# Patient Record
Sex: Female | Born: 1990 | Hispanic: Yes | Marital: Single | Smoking: Never smoker
Health system: Southern US, Community
[De-identification: ages and names within clinical notes are randomized; demographics above are authoritative.]

## PROBLEM LIST (undated history)

## (undated) DIAGNOSIS — T7840XA Allergy, unspecified, initial encounter: Secondary | ICD-10-CM

## (undated) DIAGNOSIS — E079 Disorder of thyroid, unspecified: Secondary | ICD-10-CM

## (undated) DIAGNOSIS — F419 Anxiety disorder, unspecified: Secondary | ICD-10-CM

## (undated) DIAGNOSIS — M159 Polyosteoarthritis, unspecified: Secondary | ICD-10-CM

## (undated) HISTORY — DX: Polyosteoarthritis, unspecified: M15.9

## (undated) HISTORY — DX: Allergy, unspecified, initial encounter: T78.40XA

## (undated) HISTORY — DX: Anxiety disorder, unspecified: F41.9

---

## 2010-03-18 ENCOUNTER — Ambulatory Visit: Payer: Self-pay | Admitting: Family Medicine

## 2010-03-18 DIAGNOSIS — L738 Other specified follicular disorders: Secondary | ICD-10-CM

## 2010-03-18 DIAGNOSIS — B019 Varicella without complication: Secondary | ICD-10-CM | POA: Insufficient documentation

## 2010-09-14 NOTE — Assessment & Plan Note (Signed)
Summary: NOV varicella   Vital Signs:  Patient profile:   20 year old female Height:      67.25 inches Weight:      146 pounds BMI:     22.78 O2 Sat:      98 % on Room air Pulse rate:   76 / minute BP sitting:   105 / 66  (left arm) Cuff size:   large  Vitals Entered By: Payton Spark CMA (March 18, 2010 10:26 AM)  O2 Flow:  Room air CC: New to est. Saw bright blood after bowel movement x 2 weeks ago. No problems since. Also c/o rash on abd x 1+ week.   Primary Care Provider:  Seymour Bars DO  CC:  New to est. Saw bright blood after bowel movement x 2 weeks ago. No problems since. Also c/o rash on abd x 1+ week.Marland Kitchen  History of Present Illness: 20 yo WF presents for NOV.  She has been seen at [facility] as a child.  She is Previously healthy.  Her mom was clinically diagnosed with chickenpox earlier this week and she started to have a similar rash 5 days ago.  She has pruritis with new lesions each day.  All of them have scabbed over as of now.  Scattered on her abdomen, face, back with a chronic papular rash on her arms and legs.  She denies fevers, sore throat or malaise.  Had the varicella vaccine in childhood.    Current Medications (verified): 1)  None  Allergies (verified): No Known Drug Allergies  Past History:  Past Medical History: G0 ? 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 She lives in a dorm with 2 roomates. She is not a smoker. She does some exercise. No boyfriends.  Review of Systems       no fevers/sweats/weakness, unexplained wt loss/gain, no change in vision, no difficulty hearing, ringing in ears, no hay fever/allergies, no CP/discomfort, no palpitations, no breast lump/nipple discharge, no cough/wheeze, + blood in stool, no N/V/D, no nocturia, no leaking urine, no unusual vag bleeding, no vaginal/penile  discharge, no muscle/joint pain, + rash, no new/changing mole, + HA, no memory loss, + anxiety, no sleep problem, no depression, no unexplained lumps, no easy bruising/bleeding, no concern with sexual function   Physical Exam  General:  alert, well-developed, well-nourished, and well-hydrated.   Head:  normocephalic and atraumatic.   Eyes:  conjunctiva clear Nose:  no nasal discharge.   Mouth:  pharynx pink and moist.  no lesions of the oral mucosa Neck:  no masses.   Lungs:  Normal respiratory effort, chest expands symmetrically. Lungs are clear to auscultation, no crackles or wheezes. Heart:  Normal rate and regular rhythm. S1 and S2 normal without gallop, murmur, click, rub or other extra sounds. Skin:  3 abdominal lesions with a rose colored oval base and a scabbed over vesicle over the lop.  One on the glabella and one on the R cheek.  Nontender.  papular, hypertrophic rash over the upper arms and legs.   Cervical Nodes:  No lymphadenopathy noted Psych:  slightly anxious.     Impression & Recommendations:  Problem # 1:  CHICKENPOX (ICD-052.9) 5-6 days into mild varicella illness with contact exposure at home. Clinically, she is stable.  Advised leukwarm baths/ showers with topical Caladryl cream or benadryl for itching. She is  contagious until all lesions have scabbed over. Call if any complications like fever, HA, neck pain, etc.  Problem # 2:  FOLLICULITIS (ICD-704.8) Rash, chronic, over arms and legs appears to be folliculitis.   I gave her a h/o on this condition today. Consider use of Minocycline daily in low dose for treatment.  Patient Instructions: 1)  For Chicken Pox -- use lukewarm water in the bath or shower. 2)  Cover lesion with Caladryl cream. 3)  You are contagious until all lesions have crusted over. 4)  Take a Multivitamin Daily. 5)  Add Vitamin C during cold and flu season. 6)  For itchy throat and runny nose, add Claritin once daily. 7)  Wash your hands  before eating and avoid touching your face.  8)  Read thru treatment options for folliculitis and call me if you want to start antbiotic treatment for this.

## 2010-12-20 ENCOUNTER — Encounter: Payer: Self-pay | Admitting: Family Medicine

## 2010-12-20 ENCOUNTER — Ambulatory Visit (INDEPENDENT_AMBULATORY_CARE_PROVIDER_SITE_OTHER): Payer: BC Managed Care – PPO | Admitting: Family Medicine

## 2010-12-20 VITALS — BP 87/56 | HR 84 | Ht 67.75 in | Wt 147.0 lb

## 2010-12-20 DIAGNOSIS — Z0000 Encounter for general adult medical examination without abnormal findings: Secondary | ICD-10-CM

## 2010-12-20 LAB — LIPID PANEL: HDL: 41 mg/dL (ref >39)

## 2010-12-20 NOTE — Progress Notes (Signed)
  Subjective:     Kristina Black is a 20 y.o. female and is here for a comprehensive physical exam. The patient reports problems - Recent cold. Feels she is getting some better. Marland Kitchen  History   Social History  . Marital Status: Single    Spouse Name: N/A    Number of Children: N/A  . Years of Education: N/A   Occupational History  . Student     [employer].     Social History Main Topics  . Smoking status: Never Smoker   . Smokeless tobacco: Not on file  . Alcohol Use: No  . Drug Use: No  . Sexually Active: Yes   Other Topics Concern  . Not on file   Social History Narrative   Student at [employer].    No health maintenance topics applied.  The following portions of the patient's history were reviewed and updated as appropriate: allergies, current medications, past family history, past medical history, past social history, past surgical history and problem list.  Review of Systems A comprehensive review of systems was negative.   Objective:    BP 87/56  Pulse 84  Ht 5' 7.75" (1.721 m)  Wt 147 lb (66.679 kg)  BMI 22.52 kg/m2 General appearance: alert, cooperative and appears stated age Head: Normocephalic, without obvious abnormality, atraumatic Eyes: conjunctivae/corneas clear. PERRL, EOM's intact. Fundi benign., PEERLA, EOMi, conjunctiva clear.  Ears: normal TM's and external ear canals both ears Nose: Nares normal. Septum midline. Mucosa normal. No drainage or sinus tenderness. Throat: lips, mucosa, and tongue normal; teeth and gums normal Neck: no adenopathy, no carotid bruit, supple, symmetrical, trachea midline and thyroid not enlarged, symmetric, no tenderness/mass/nodules Back: symmetric, no curvature. ROM normal. No CVA tenderness. Lungs: clear to auscultation bilaterally Heart: regular rate and rhythm, S1, S2 normal, no murmur, click, rub or gallop Abdomen: soft, non-tender; bowel sounds normal; no masses,  no organomegaly Extremities: extremities normal,  atraumatic, no cyanosis or edema Pulses: 2+ and symmetric Skin: Skin color, texture, turgor normal. No rashes or lesions Lymph nodes: Cervical, supraclavicular, and axillary nodes normal. Neurologic: Grossly normal    Assessment:    Healthy female exam.      Plan:     See After Visit Summary for Counseling Recommendations   Due to check thyroid Discussed birth control Will check urine GC/Chlam and HIV and syphillis testing Encouraged regular exercise and daily calcium.

## 2010-12-20 NOTE — Patient Instructions (Signed)
We will call you with your lab results Make sure getting regular exercise and 4 servings of dairy a day or take a calcium supplement to protect your bones.

## 2010-12-21 ENCOUNTER — Telehealth: Payer: Self-pay | Admitting: Family Medicine

## 2010-12-21 LAB — COMPLETE METABOLIC PANEL WITH GFR
CO2: 27 mEq/L (ref 19–32)
Calcium: 9.3 mg/dL (ref 8.4–10.5)
Chloride: 105 mEq/L (ref 96–112)
GFR, Est African American: >60 mL/min (ref >60)
GFR, Est Non African American: >60 mL/min (ref >60)
Glucose, Bld: 89 mg/dL (ref 70–99)
Sodium: 140 mEq/L (ref 135–145)
Total Bilirubin: 0.4 mg/dL (ref 0.3–1.2)
Total Protein: 6.8 g/dL (ref 6.0–8.3)

## 2010-12-21 LAB — HIV ANTIBODY (ROUTINE TESTING W REFLEX): HIV: NONREACTIVE

## 2010-12-21 LAB — GC/CHLAMYDIA PROBE AMP, URINE: Chlamydia, Swab/Urine, PCR: NEGATIVE

## 2010-12-21 MED ORDER — LEVOTHYROXINE SODIUM 75 MCG PO TABS: 75.0000 ug | ORAL_TABLET | Freq: Every day | ORAL | Status: DC

## 2010-12-21 NOTE — Telephone Encounter (Signed)
Consultation: Complete metabolic panel and cholesterol looked great. Her thyroid is elevated little bit so we do need to adjust her dose. Recheck thyroid level in approximately 3 months. I will send her for a new prescription. Her HIV is negative and her syphilis test is negative as well. I'm still awaiting the urine test results.

## 2010-12-21 NOTE — Telephone Encounter (Signed)
Call pt: GC/chlam are neg.

## 2010-12-21 NOTE — Telephone Encounter (Signed)
Left message on pt's cell.ok to leave on cell  per pt

## 2010-12-21 NOTE — Telephone Encounter (Signed)
Mom notified about cmp and thyroid and spoke to pt about STD results. Mom will come and pick up copy of TSH and chol

## 2010-12-24 ENCOUNTER — Other Ambulatory Visit: Payer: Self-pay | Admitting: *Deleted

## 2010-12-24 DIAGNOSIS — E039 Hypothyroidism, unspecified: Secondary | ICD-10-CM

## 2010-12-24 MED ORDER — LEVOTHYROXINE SODIUM 75 MCG PO TABS: 75.0000 ug | ORAL_TABLET | Freq: Every day | ORAL | Status: DC

## 2010-12-24 NOTE — Telephone Encounter (Signed)
Mom called and states pt rx were to go to [facility]. Sent rx to [facility]

## 2010-12-28 ENCOUNTER — Encounter: Payer: Self-pay | Admitting: Family Medicine

## 2011-01-03 ENCOUNTER — Ambulatory Visit (INDEPENDENT_AMBULATORY_CARE_PROVIDER_SITE_OTHER): Payer: BC Managed Care – PPO | Admitting: Family Medicine

## 2011-01-03 ENCOUNTER — Encounter: Payer: Self-pay | Admitting: Family Medicine

## 2011-01-03 VITALS — BP 103/70 | HR 92 | Wt 151.0 lb

## 2011-01-03 DIAGNOSIS — F319 Bipolar disorder, unspecified: Secondary | ICD-10-CM | POA: Insufficient documentation

## 2011-01-03 DIAGNOSIS — F39 Unspecified mood [affective] disorder: Secondary | ICD-10-CM

## 2011-01-03 DIAGNOSIS — F411 Generalized anxiety disorder: Secondary | ICD-10-CM

## 2011-01-03 DIAGNOSIS — F633 Trichotillomania: Secondary | ICD-10-CM

## 2011-01-03 DIAGNOSIS — F6389 Other impulse disorders: Secondary | ICD-10-CM

## 2011-01-03 MED ORDER — PAROXETINE HCL 10 MG PO TABS: 20.0000 mg | ORAL_TABLET | Freq: Every day | ORAL | Status: DC

## 2011-01-03 NOTE — Assessment & Plan Note (Addendum)
Unclear etiology. Mood questionnaire screen is +.  PHQ-9 score of 14 (Moderate).  GAD-7 score 11 (moderate).  We discussed her potential DX an that it is possible she is bipolar. 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  Additionally psychiatry for a more formal diagnosis. It she didn't bring a list of medications as she has tried in the past. She did try g which seemed to actually help her but she had to discontinue it because a rash that started on her hand. At this point in time I am going to start her on Paxil. We can certainly monitor for weight gain. This is also indicated for OCD but I explained to her with the potential for bipolar disease we need to make sure that she is not feeling manic on the medication. I will also refer her for counseling as well. Followup in 3-4 weeks. If she has any depressive thoughts please call the office.

## 2011-01-03 NOTE — Progress Notes (Signed)
  Subjective:    Patient ID: Kristina Black, female    DOB: [birth year], 20 y.o.   MRN: [id number]  HPI She was really doing well in school but was so anxious she felt she couldn't function so she dropped out of a semester. She started having panic attacks. Had some milder shorter episodes in [facility]. She feels she is a perfectionist.  She also has a hhx of body image disorder because she stopped eating and quit having her periods.  Will shake at times from her nervousness. She went on Prozac in her Junior year. She did well on it and made straight As that semester.  Then her senior year of HS and wasn't consistant with the med and they upped the dose and this made her feel sleepy and tired.  In college didn't take medications.  Then started another med her second year of college and thinks took zoloft but felt tired all the time.  Says sometimes she gets so anxious that she feels depressed. Did have some suicidal thoughts in her first year of college.  She is not currently suicidal.  She was doing some counseling through her college.  She was last on celexa. Hasn't taken it in about 2-3 weeks.  She also pulls her hair out of nervousness.    Went to see psych in [place] and they felt she might have Bipolar.  But they also mixed up her medical records so she and her mother are very concerned about this.  She is interested in counseling and seeing psychiatry.    Feels the worse in the fall.   Review of Systems     Objective:   Physical Exam  Constitutional: She appears well-developed and well-nourished.  Psychiatric: She has a normal mood and affect. Her behavior is normal. Judgment and thought content normal.      she has 2 large areas one behind each year for the hairs have been broken and are starting to grow back in.     Assessment & Plan:  30 minutes was spent face-to-face with this patient discussing options and counseling.

## 2011-01-03 NOTE — Assessment & Plan Note (Addendum)
GAD-7 score of 11 today.  Discused options.

## 2011-01-24 ENCOUNTER — Ambulatory Visit (HOSPITAL_COMMUNITY): Payer: BC Managed Care – PPO | Admitting: Behavioral Health

## 2011-01-28 ENCOUNTER — Ambulatory Visit (INDEPENDENT_AMBULATORY_CARE_PROVIDER_SITE_OTHER): Payer: BC Managed Care – PPO | Admitting: Behavioral Health

## 2011-01-28 DIAGNOSIS — F411 Generalized anxiety disorder: Secondary | ICD-10-CM

## 2011-02-10 ENCOUNTER — Encounter (INDEPENDENT_AMBULATORY_CARE_PROVIDER_SITE_OTHER): Payer: BC Managed Care – PPO | Admitting: Behavioral Health

## 2011-02-10 DIAGNOSIS — F411 Generalized anxiety disorder: Secondary | ICD-10-CM

## 2011-02-24 ENCOUNTER — Encounter (HOSPITAL_COMMUNITY): Payer: BC Managed Care – PPO | Admitting: Behavioral Health

## 2011-02-25 ENCOUNTER — Other Ambulatory Visit: Payer: Self-pay | Admitting: Family Medicine

## 2011-02-25 NOTE — Telephone Encounter (Signed)
Mother called for this patient and she was not given enough medication of paroxetine 20 mg when she was last seen.   Plan:  Reviewed the patient chart and saw that a month worth of medication was given because pt is to follow back up within the month due to start of the medication.  Pts mom voiced understanding, and pt appt moved up from Wednesday of next week to Monday 02-28-11 so pt can get another script. Jarvis Newcomer, LPN [facility]

## 2011-02-28 ENCOUNTER — Encounter: Payer: Self-pay | Admitting: Family Medicine

## 2011-02-28 ENCOUNTER — Ambulatory Visit (INDEPENDENT_AMBULATORY_CARE_PROVIDER_SITE_OTHER): Payer: BC Managed Care – PPO | Admitting: Family Medicine

## 2011-02-28 DIAGNOSIS — E039 Hypothyroidism, unspecified: Secondary | ICD-10-CM

## 2011-02-28 DIAGNOSIS — R5381 Other malaise: Secondary | ICD-10-CM

## 2011-02-28 DIAGNOSIS — R5383 Other fatigue: Secondary | ICD-10-CM

## 2011-02-28 MED ORDER — PAROXETINE HCL 40 MG PO TABS: 40.0000 mg | ORAL_TABLET | ORAL | Status: DC

## 2011-02-28 NOTE — Progress Notes (Signed)
  Subjective:    Patient ID: Kristina Black, female    DOB: 16-Sep-1990, 20 y.o.   MRN: [id number]  HPI  Seeing Serafina Mitchell for counseling.  Start her paxil as well. Feel tired all the time but felt this way before starting the paxil.  Has been hard to get out of bed. Feels she hasn't noticed a big difference in her mood.  Still sleeping too much.  Had a hard time the day before her exam.  Saythis weekend slept all weekend.  She doesn't have an appt with psych.   Review of Systems     Objective:   Physical Exam  Constitutional: She is oriented to person, place, and time. She appears well-developed and well-nourished.  HENT:  Head: Normocephalic and atraumatic.  Cardiovascular: Normal rate, regular rhythm and normal heart sounds.   Pulmonary/Chest: Effort normal and breath sounds normal.  Neurological: She is alert and oriented to person, place, and time.  Skin: Skin is warm and dry.  Psychiatric: She has a normal mood and affect.          Assessment & Plan:  Anxiety - GAD 7 score of 9 today.  Previously 11. Some improvement. No neg SE.  Seeing peters for counseling. F/u in 3 weeks as she is supposed to start school back.    Fatigue - Recheck thyroid and rule out anemia. Had normal CMP previously.

## 2011-03-01 ENCOUNTER — Telehealth: Payer: Self-pay | Admitting: Family Medicine

## 2011-03-01 LAB — CBC
Hemoglobin: 13.1 g/dL (ref 12.0–15.0)
MCH: 28.4 pg (ref 26.0–34.0)
Platelets: 186 10*3/uL (ref 150–400)
RBC: 4.62 MIL/uL (ref 3.87–5.11)
WBC: 6.8 10*3/uL (ref 4.0–10.5)

## 2011-03-01 LAB — VITAMIN B12: Vitamin B-12: 864 pg/mL (ref 211–911)

## 2011-03-01 NOTE — Telephone Encounter (Signed)
Call patient: No anemia. Thyroid looks perfect so her dose of her thyroid medication is perfect. Continue to make sure she's taking it daily about an hour before breakfast. B12 is normal

## 2011-03-02 ENCOUNTER — Ambulatory Visit: Payer: BC Managed Care – PPO | Admitting: Family Medicine

## 2011-03-02 ENCOUNTER — Encounter (INDEPENDENT_AMBULATORY_CARE_PROVIDER_SITE_OTHER): Payer: BC Managed Care – PPO | Admitting: Behavioral Health

## 2011-03-02 DIAGNOSIS — F411 Generalized anxiety disorder: Secondary | ICD-10-CM

## 2011-03-02 NOTE — Telephone Encounter (Signed)
LM with family member to have the pt call and speak with the triage nurse to discuss lab results. Jarvis Newcomer, LPN [facility]

## 2011-03-07 ENCOUNTER — Telehealth: Payer: Self-pay | Admitting: Family Medicine

## 2011-03-08 NOTE — Telephone Encounter (Signed)
Closed

## 2011-03-10 ENCOUNTER — Ambulatory Visit (HOSPITAL_COMMUNITY): Payer: BC Managed Care – PPO | Admitting: Physician Assistant

## 2011-03-14 NOTE — Telephone Encounter (Signed)
Pt 's father notified that labs are normal

## 2011-03-21 ENCOUNTER — Encounter: Payer: Self-pay | Admitting: Family Medicine

## 2011-03-21 ENCOUNTER — Ambulatory Visit (INDEPENDENT_AMBULATORY_CARE_PROVIDER_SITE_OTHER): Payer: BC Managed Care – PPO | Admitting: Family Medicine

## 2011-03-21 VITALS — BP 106/62 | HR 77 | Wt 149.0 lb

## 2011-03-21 DIAGNOSIS — F411 Generalized anxiety disorder: Secondary | ICD-10-CM

## 2011-03-21 DIAGNOSIS — F6389 Other impulse disorders: Secondary | ICD-10-CM

## 2011-03-21 DIAGNOSIS — F633 Trichotillomania: Secondary | ICD-10-CM

## 2011-03-21 NOTE — Assessment & Plan Note (Signed)
Rec trial of N-acetyl cystein which is OTC. Try for one month and see if helps.

## 2011-03-21 NOTE — Patient Instructions (Addendum)
N-acetyl cysteine supplement OTC for the hair pulling.  Try to follow up at fall or Christmas break  If not able to get in with psychiatry by then.

## 2011-03-21 NOTE — Assessment & Plan Note (Addendum)
GAD- 7 score of 6 today, down from 11 a month ago. She has improved. Continue current regimen . Starting back to college will be the real test. I doesn't have appt with psych then recommend f/u on her fall or X-mas break if able.

## 2011-03-21 NOTE — Progress Notes (Signed)
  Subjective:    Patient ID: Kristina Black, female    DOB: 1991/06/23, 20 y.o.   MRN: [id number]  HPI  Anxiety - Overall she is feeling better.  She is nervous about starting school again.  No neg side effects.  Hair pulling is maybe a little better. Her hairs is growing back in patches.   Not seeing Elmer Sow) right now since starting back to school.She plans on calling her school to get connected with someone through her college for counseling and to see pyschiatry.    Review of Systems     Objective:   Physical Exam  Constitutional: She is oriented to person, place, and time. She appears well-developed and well-nourished.  HENT:  Head: Normocephalic and atraumatic.  Cardiovascular: Normal rate, regular rhythm and normal heart sounds.   Pulmonary/Chest: Effort normal and breath sounds normal.  Neurological: She is alert and oriented to person, place, and time.  Psychiatric: She has a normal mood and affect. Her behavior is normal. Judgment and thought content normal.          Assessment & Plan:

## 2011-06-15 ENCOUNTER — Other Ambulatory Visit: Payer: Self-pay | Admitting: Family Medicine

## 2011-07-28 ENCOUNTER — Encounter: Payer: Self-pay | Admitting: Family Medicine

## 2011-08-01 ENCOUNTER — Encounter: Payer: Self-pay | Admitting: Family Medicine

## 2011-08-01 ENCOUNTER — Ambulatory Visit (INDEPENDENT_AMBULATORY_CARE_PROVIDER_SITE_OTHER): Payer: BC Managed Care – PPO | Admitting: Family Medicine

## 2011-08-01 VITALS — BP 94/57 | HR 76 | Wt 154.0 lb

## 2011-08-01 DIAGNOSIS — L858 Other specified epidermal thickening: Secondary | ICD-10-CM

## 2011-08-01 DIAGNOSIS — Q828 Other specified congenital malformations of skin: Secondary | ICD-10-CM

## 2011-08-01 DIAGNOSIS — F319 Bipolar disorder, unspecified: Secondary | ICD-10-CM

## 2011-08-01 DIAGNOSIS — E039 Hypothyroidism, unspecified: Secondary | ICD-10-CM

## 2011-08-01 MED ORDER — TRIAMCINOLONE ACETONIDE 0.5 % EX OINT: TOPICAL_OINTMENT | Freq: Every day | CUTANEOUS | Status: AC

## 2011-08-01 NOTE — Progress Notes (Signed)
  Subjective:    Patient ID: Kristina Black, female    DOB: 1990-12-31, 20 y.o.   MRN: [id number]  HPI On August 31st saw a counselor through school. They rx her depakote ( Dr Stacie Acres). Says it was really helpful until she shipped it over the fall break.  Says will occ forgot to take it.  Feels weird since last Wednesay. Also had a URI since last week.  Says in October started not doing as well. Has been really tearful.  Says she has been very inconsistant with her depakote and her thyroid medication.She hasn't made appt with new counselor yet.      Review of Systems     Objective:   Physical Exam  Constitutional: She is oriented to person, place, and time. She appears well-developed and well-nourished.  HENT:  Head: Normocephalic and atraumatic.  Cardiovascular: Normal rate, regular rhythm and normal heart sounds.   Pulmonary/Chest: Effort normal and breath sounds normal.  Neurological: She is alert and oriented to person, place, and time.  Skin: Skin is warm and dry.       She has some scarring on the backs of her arms. And also some fine dry scaling papules scattered.   Psychiatric: She has a normal mood and affect. Her behavior is normal.          Assessment & Plan:  Mood d/o - did really well on depakote for possible Bipolar.  Will check depaote level thought suspect it will be low since she has been very inconsistant. We discussed startegies to get back into a routine with her meds. We also discussed she need to go ahead and find a new psych in the area where she is leaving. She was supposed to have done this but says she really hasn't. We discussed that needs to be on her "To Do List" for tomorrow as they will likely have a 2-3 mo wait.   Keratosis pilaris - will tx with topicla steroid cream. Can use for no more than 2 weeks in a row.    Hypothyroid - Discussed needs to be more consistant. Also discussed strategies for taking this. Recheck level. Her TSH will likely be elevated.

## 2011-08-01 NOTE — Patient Instructions (Signed)
We will call you with your lab results. If you don't here from us in about a week then please give us a call at [phone].  

## 2011-08-02 LAB — COMPLETE METABOLIC PANEL WITH GFR
ALT: 14 U/L (ref 0–35)
AST: 15 U/L (ref 0–37)
Albumin: 4 g/dL (ref 3.5–5.2)
Alkaline Phosphatase: 74 U/L (ref 39–117)
Glucose, Bld: 95 mg/dL (ref 70–99)
Potassium: 4.2 mEq/L (ref 3.5–5.3)
Sodium: 140 mEq/L (ref 135–145)
Total Bilirubin: 0.3 mg/dL (ref 0.3–1.2)
Total Protein: 6.1 g/dL (ref 6.0–8.3)

## 2011-08-02 LAB — VALPROIC ACID LEVEL: Valproic Acid Lvl: 31.8 ug/mL — ABNORMAL LOW (ref 50.0–100.0)

## 2011-08-03 MED ORDER — LEVOTHYROXINE SODIUM 75 MCG PO TABS: 75.0000 ug | ORAL_TABLET | Freq: Every day | ORAL | Status: DC

## 2011-08-03 NOTE — Progress Notes (Signed)
Addended by: Ellsworth Lennox on: 08/03/2011 10:22 AM   Modules accepted: Orders

## 2011-10-09 ENCOUNTER — Other Ambulatory Visit: Payer: Self-pay | Admitting: Family Medicine

## 2011-10-13 ENCOUNTER — Encounter: Payer: Self-pay | Admitting: *Deleted

## 2011-10-21 ENCOUNTER — Ambulatory Visit: Payer: BC Managed Care – PPO | Admitting: Family Medicine

## 2011-10-26 ENCOUNTER — Encounter: Payer: Self-pay | Admitting: *Deleted

## 2011-11-02 ENCOUNTER — Ambulatory Visit: Payer: BC Managed Care – PPO | Admitting: Family Medicine

## 2012-08-10 ENCOUNTER — Ambulatory Visit (INDEPENDENT_AMBULATORY_CARE_PROVIDER_SITE_OTHER): Payer: BC Managed Care – PPO | Admitting: Family Medicine

## 2012-08-10 ENCOUNTER — Encounter: Payer: Self-pay | Admitting: Family Medicine

## 2012-08-10 VITALS — BP 96/58 | HR 77 | Resp 16 | Wt 157.0 lb

## 2012-08-10 DIAGNOSIS — E039 Hypothyroidism, unspecified: Secondary | ICD-10-CM | POA: Insufficient documentation

## 2012-08-10 DIAGNOSIS — R5383 Other fatigue: Secondary | ICD-10-CM

## 2012-08-10 DIAGNOSIS — R110 Nausea: Secondary | ICD-10-CM

## 2012-08-10 DIAGNOSIS — M19049 Primary osteoarthritis, unspecified hand: Secondary | ICD-10-CM

## 2012-08-10 DIAGNOSIS — Z23 Encounter for immunization: Secondary | ICD-10-CM

## 2012-08-10 DIAGNOSIS — M25549 Pain in joints of unspecified hand: Secondary | ICD-10-CM

## 2012-08-10 NOTE — Progress Notes (Signed)
  Subjective:    Patient ID: Kristina Black, female    DOB: 1990-09-10, 21 y.o.   MRN: [id number]  HPI Nausea for about an hour daily for the month of November.  Month of Dec is better. Will occ vomit. Says she feels tired all the time not matter how much sleep she gets. She is over sleeping.  Does have frequent nightime awakenings. Sometimes after she eats she feels more tired. Occ notices bright red blood in the stool.  No blood in the urine. No sig abddominal pain or fever with this. Episodes are not related to the nausea. Thinks her nausea is anxiety related.  Vomiting only seems ot happen when really stressed.  No GERD. No food triggers.   Started seeing a new psychiatrist here in [place]. Saw her for the first time about 10 days ago. She wanted her to have her thyroid done.    Hypothyroid - No skin or hair changes.  Feels like she is gaining weight. Has been more fatigued.  She wants me ot take over prescribing this for her. Former pscyh was controlling this for her.     Review of Systems    + joint pain, worse int the AM. Wose with changes in temp. They feel stiff esp in the DIPs and PIPs.  Stiffness usually lasts about 1.5 hours. Cracks her knuckles and that feels better. No meds for pain or discomfort.  No swelling.  No worsening or alleviating sxs.  Objective:   Physical Exam  Constitutional: She is oriented to person, place, and time. She appears well-developed and well-nourished.  HENT:  Head: Normocephalic and atraumatic.  Cardiovascular: Normal rate, regular rhythm and normal heart sounds.   Pulmonary/Chest: Effort normal and breath sounds normal.  Abdominal: Soft. Bowel sounds are normal. She exhibits no distension and no mass. There is no tenderness. There is no rebound and no guarding.  Musculoskeletal:       No deformity of the hands. No swelling of the joints or erythema.    Neurological: She is alert and oriented to person, place, and time.  Skin: Skin is warm and dry.    Psychiatric: She has a normal mood and affect. Her behavior is normal.          Assessment & Plan:  Nausea - I agree that this is most likely anxiety related. I did offer to put her on an H2 blocker for couple weeks to see if it improves her symptoms. She's not having any reflux-type symptoms. She declined at this time additional workup her psychiatrist and let me know if it continues to be a problem.  Fatigue - Check CBC and TSH. Rule out anemia and make sure that her thyroid level is appropriate. Check CMP  Hypothyroid - Recheck level today  Blood in stool- unclear if this is related to constipation or not. She says she really discern a lot of the details around the episode happened about 3-4 weeks ago. Asked her to let me know if happens again especially if it occurs without any other symptoms such as constipation.  MAKE SURE drinking plenty of fluids and getting plenty of fiber   Joint pain in hands - No swelling or redness. Will check uric acid, sed rate, RA, etc.   Given flu shot today.

## 2012-08-11 LAB — CBC WITH DIFFERENTIAL/PLATELET
Basophils Absolute: 0 10*3/uL (ref 0.0–0.1)
HCT: 38.3 % (ref 36.0–46.0)
Hemoglobin: 13.2 g/dL (ref 12.0–15.0)
Lymphocytes Relative: 27 % (ref 12–46)
Monocytes Absolute: 0.4 10*3/uL (ref 0.1–1.0)
Monocytes Relative: 6 % (ref 3–12)
Neutro Abs: 5 10*3/uL (ref 1.7–7.7)
RDW: 14.5 % (ref 11.5–15.5)
WBC: 7.5 10*3/uL (ref 4.0–10.5)

## 2012-08-11 LAB — COMPLETE METABOLIC PANEL WITH GFR
ALT: 8 U/L (ref 0–35)
AST: 11 U/L (ref 0–37)
Albumin: 4.5 g/dL (ref 3.5–5.2)
BUN: 11 mg/dL (ref 6–23)
Calcium: 9.6 mg/dL (ref 8.4–10.5)
Chloride: 102 mEq/L (ref 96–112)
Potassium: 4.4 mEq/L (ref 3.5–5.3)
Sodium: 140 mEq/L (ref 135–145)
Total Protein: 6.8 g/dL (ref 6.0–8.3)

## 2012-08-11 LAB — SEDIMENTATION RATE: Sed Rate: 5 mm/hr (ref 0–22)

## 2012-08-11 LAB — TSH: TSH: 0.47 u[IU]/mL (ref 0.350–4.500)

## 2012-09-05 ENCOUNTER — Telehealth: Payer: Self-pay | Admitting: *Deleted

## 2012-09-05 DIAGNOSIS — Z13220 Encounter for screening for lipoid disorders: Secondary | ICD-10-CM

## 2012-09-05 NOTE — Telephone Encounter (Signed)
Printed

## 2012-09-05 NOTE — Telephone Encounter (Signed)
Pt calls and needs to have cholesterol checked for class she is in for school. Can you enter order or I can just need diagnosis

## 2012-12-10 ENCOUNTER — Ambulatory Visit: Payer: 59 | Admitting: Family Medicine

## 2012-12-18 ENCOUNTER — Other Ambulatory Visit: Payer: Self-pay | Admitting: Family Medicine

## 2012-12-18 LAB — LIPID PANEL
HDL: 43 mg/dL (ref >39)
LDL Cholesterol: 81 mg/dL (ref 0–99)
Total CHOL/HDL Ratio: 3.2 Ratio
Triglycerides: 75 mg/dL (ref <150)
VLDL: 15 mg/dL (ref 0–40)

## 2012-12-19 LAB — TSH: TSH: 3.369 u[IU]/mL (ref 0.350–4.500)

## 2012-12-19 NOTE — Progress Notes (Signed)
Quick Note:  All labs are normal. ______ 

## 2012-12-20 ENCOUNTER — Other Ambulatory Visit: Payer: Self-pay | Admitting: *Deleted

## 2012-12-20 MED ORDER — LEVOTHYROXINE SODIUM 100 MCG PO TABS: 100.0000 ug | ORAL_TABLET | Freq: Every day | ORAL | Status: DC

## 2013-03-04 ENCOUNTER — Other Ambulatory Visit: Payer: Self-pay | Admitting: Family Medicine

## 2013-03-26 ENCOUNTER — Encounter: Payer: Self-pay | Admitting: Family Medicine

## 2013-03-26 ENCOUNTER — Ambulatory Visit (INDEPENDENT_AMBULATORY_CARE_PROVIDER_SITE_OTHER): Payer: 59 | Admitting: Family Medicine

## 2013-03-26 ENCOUNTER — Telehealth: Payer: Self-pay | Admitting: Family Medicine

## 2013-03-26 VITALS — BP 100/56 | HR 78 | Ht 67.75 in | Wt 168.0 lb

## 2013-03-26 DIAGNOSIS — R300 Dysuria: Secondary | ICD-10-CM

## 2013-03-26 DIAGNOSIS — R5383 Other fatigue: Secondary | ICD-10-CM

## 2013-03-26 DIAGNOSIS — E039 Hypothyroidism, unspecified: Secondary | ICD-10-CM

## 2013-03-26 DIAGNOSIS — Z79899 Other long term (current) drug therapy: Secondary | ICD-10-CM

## 2013-03-26 DIAGNOSIS — R5381 Other malaise: Secondary | ICD-10-CM

## 2013-03-26 DIAGNOSIS — R42 Dizziness and giddiness: Secondary | ICD-10-CM

## 2013-03-26 LAB — POCT URINALYSIS DIPSTICK
Ketones, UA: NEGATIVE
Leukocytes, UA: NEGATIVE
Protein, UA: NEGATIVE
pH, UA: 7.5

## 2013-03-26 NOTE — Progress Notes (Signed)
  Subjective:    Patient ID: Kristina Black, female    DOB: 1991/04/04, 22 y.o.   MRN: [id number]  HPI Hypothyroidism-No skin or hair changes.  Has gained about 10 lbs this summer. Hasn't been as active and admits has been eating more at home.   Lab Results  Component Value Date   TSH 3.369 12/18/2012   Her pscyh recommended she come in for testing for Diabetes.  They are weaning her Depakote and she has now started Abilify. Likely they want to screen for diabetes because of this. We can also recheck her thyroid to make sure that her levels are adequate as she has felt more lethargic and fatigued lately.  She feels dizzy and restless. No room spinning.  Started about a week ago. Had a ST x 2 days but that is better.    She started adjusting her meds about 2 weeks ago.  Says her body feels heavy.  Feels like she can't write or type well. Feels like had dec sensation in her hands or toes.  Sleeping a lot. She has been urinating more frequently.  No fever or URI sxs.  Had some left sided somtahc pain yesterday but none today.  No other GI pain or diarrhea.   N Review of Systems No swollen LN.     Objective:   Physical Exam  Constitutional: She is oriented to person, place, and time. She appears well-developed and well-nourished.  HENT:  Head: Normocephalic and atraumatic.  Right Ear: External ear normal.  Left Ear: External ear normal.  Nose: Nose normal.  Mouth/Throat: Oropharynx is clear and moist.  TMs and canals are clear.   Eyes: Conjunctivae and EOM are normal. Pupils are equal, round, and reactive to light.  Neck: Neck supple. No thyromegaly present.  Cardiovascular: Normal rate, regular rhythm and normal heart sounds.   Pulmonary/Chest: Effort normal and breath sounds normal. She has no wheezes.  Abdominal: Soft. Bowel sounds are normal. She exhibits no distension and no mass. There is no tenderness. There is no rebound and no guarding.  Musculoskeletal: She exhibits no edema.   Lymphadenopathy:    She has no cervical adenopathy.  Neurological: She is alert and oriented to person, place, and time.  Skin: Skin is warm and dry.  Psychiatric: She has a normal mood and affect.          Assessment & Plan:  Hypothyroid- will recheck thyroid level. She's had no significant skin or hair changes that she has gained 10 pounds this is probably from lack of exercise, dietary changes in the edition Abilify which doesn't cause weight gain.  Urinary frequency-will check urinalysis to evaluate for UTI  Fatigue-check CBC to evaluate for anemia and infection.  Dizziness - no fluid in her ears. She denies true vertigo. She just feels more lightheaded. Encouraged her to stay hydrated and rest. We will check her blood work and call her soon as we get the results back. Also consider this could be related to recent changes in her medication regimen. She also starts school back in about a week so also consider this could be related to her generalized anxiety disorder.

## 2013-03-26 NOTE — Telephone Encounter (Signed)
Please call patient and let her know that the urine was negative for infection.

## 2013-03-26 NOTE — Telephone Encounter (Signed)
Pt informed of results.  Also pt's mother was asking about lansoprazole I informed her that Dr.Metheney did not feel a need for her to take this given the sxs she described to her during the OV. I told her what the med was for and if she was having these sxs she can pick this up OTC. Pt voiced understanding and agreed.Kristina Black [place]

## 2013-03-27 ENCOUNTER — Telehealth: Payer: Self-pay | Admitting: *Deleted

## 2013-03-27 LAB — CBC WITH DIFFERENTIAL/PLATELET
Basophils Absolute: 0 10*3/uL (ref 0.0–0.1)
Basophils Relative: 0 % (ref 0–1)
Eosinophils Absolute: 0 10*3/uL (ref 0.0–0.7)
Eosinophils Relative: 1 % (ref 0–5)
Lymphs Abs: 1.6 10*3/uL (ref 0.7–4.0)
MCH: 28.5 pg (ref 26.0–34.0)
MCHC: 32.6 g/dL (ref 30.0–36.0)
MCV: 87.4 fL (ref 78.0–100.0)
Neutrophils Relative %: 67 % (ref 43–77)
Platelets: 196 10*3/uL (ref 150–400)
RDW: 14.6 % (ref 11.5–15.5)

## 2013-03-27 LAB — COMPLETE METABOLIC PANEL WITH GFR
ALT: 10 U/L (ref 0–35)
BUN: 10 mg/dL (ref 6–23)
CO2: 29 mEq/L (ref 19–32)
Calcium: 9.4 mg/dL (ref 8.4–10.5)
Chloride: 103 mEq/L (ref 96–112)
Creat: 0.94 mg/dL (ref 0.50–1.10)
GFR, Est African American: >89 mL/min
Glucose, Bld: 93 mg/dL (ref 70–99)

## 2013-03-27 LAB — VALPROIC ACID LEVEL: Valproic Acid Lvl: 58.8 ug/mL (ref 50.0–100.0)

## 2013-03-27 NOTE — Telephone Encounter (Signed)
Pt's lab results faxed to Dr. Boone Master.   Dr. Ron Agee phone # [phone].Kristina Black

## 2013-04-09 ENCOUNTER — Telehealth: Payer: Self-pay

## 2013-04-09 NOTE — Telephone Encounter (Signed)
Alizzon's mom called and left a message to call her back.   I called 3 times and the line was busy. Will try again later.

## 2013-04-12 ENCOUNTER — Ambulatory Visit (INDEPENDENT_AMBULATORY_CARE_PROVIDER_SITE_OTHER): Payer: 59 | Admitting: Family Medicine

## 2013-04-12 ENCOUNTER — Encounter: Payer: Self-pay | Admitting: Family Medicine

## 2013-04-12 VITALS — BP 102/58 | HR 90 | Wt 163.0 lb

## 2013-04-12 DIAGNOSIS — M2550 Pain in unspecified joint: Secondary | ICD-10-CM

## 2013-04-12 DIAGNOSIS — IMO0001 Reserved for inherently not codable concepts without codable children: Secondary | ICD-10-CM

## 2013-04-12 DIAGNOSIS — T887XXA Unspecified adverse effect of drug or medicament, initial encounter: Secondary | ICD-10-CM

## 2013-04-12 DIAGNOSIS — T50905A Adverse effect of unspecified drugs, medicaments and biological substances, initial encounter: Secondary | ICD-10-CM

## 2013-04-12 NOTE — Progress Notes (Signed)
  Subjective:    Patient ID: Kristina Black, female    DOB: 15-Sep-1990, 22 y.o.   MRN: [id number]  HPI ON the abiligy started getting tightening in her hands and her chest felt heavy. Ended up going to the emergency dept on the firt day of school and they felt it was psycho somatic.  Her mom, who is with her here today, since they did do a cardiac workup that was negative. She thinks they tested muscle enzyme is not sure. Says her arms, wrist, hands and shoulder hurt as well.  Says icey-hot helps a little but feels her bones are hurting. Says her legs feel really restless.  Says feels exhausted. Says shoulder really hurt.  She is now doing partime status for school. Had blurred vision. She has been off the abilify x 10-11 days.  She is seeing pscyh and they told her her sxs should be gone by now. Still weaning her off the depakot.  .no heart flutters or palpitations.  She has also been constipated for about 2 weeks.  Still feels some dizzy.    Review of Systems     Objective:   Physical Exam  Constitutional: She is oriented to person, place, and time. She appears well-developed and well-nourished.  HENT:  Head: Normocephalic and atraumatic.  Eyes: Conjunctivae are normal. Pupils are equal, round, and reactive to light.  Neck: Neck supple. No tracheal deviation present. No thyromegaly present.  Cardiovascular: Normal rate, regular rhythm and normal heart sounds.   Pulmonary/Chest: Effort normal and breath sounds normal.  Abdominal: Soft. Bowel sounds are normal. She exhibits no distension and no mass. There is no tenderness. There is no rebound and no guarding.  Lymphadenopathy:    She has no cervical adenopathy.  Neurological: She is alert and oriented to person, place, and time.  Skin: Skin is warm and dry.  Psychiatric: She has a normal mood and affect. Her behavior is normal.          Assessment & Plan:  Myalgias/polyarthralgia-I. think this is mostly psychosomatic and possibly a  side effect of the Abilify. She is actually gradually been a little bit better. We will check a CK for muscle enzyme breakdown. Also recheck electrolytes today were normal on the 12th. Her thyroid look great at that time as well. We'll also check a sedimentation rate and CRP for inflammatory markers. I think the biggest thing is that she just needs some bed rest and to give this more time for the Abilify to get completely out of her system. She said she did finally sleep better last night. She is now home staying with her mom. She is still on Depakote and Prozac. She's working with her psychiatrist on this. Her mom would like to have B12, B6, B 1 tested.  Medication S.E - will add Abilify to her intolerance list.  Time spent 25 minutes, greater than 50% time spent in counseling about myalgias and arthralgias and her medication side effects.

## 2013-04-13 LAB — COMPLETE METABOLIC PANEL WITH GFR
Alkaline Phosphatase: 51 U/L (ref 39–117)
BUN: 13 mg/dL (ref 6–23)
Creat: 0.87 mg/dL (ref 0.50–1.10)
GFR, Est Non African American: >89 mL/min
Glucose, Bld: 81 mg/dL (ref 70–99)
Total Bilirubin: 0.4 mg/dL (ref 0.3–1.2)

## 2013-04-13 LAB — CBC WITH DIFFERENTIAL/PLATELET
Basophils Relative: 0 % (ref 0–1)
Eosinophils Absolute: 0 10*3/uL (ref 0.0–0.7)
Eosinophils Relative: 0 % (ref 0–5)
HCT: 39.1 % (ref 36.0–46.0)
Hemoglobin: 13 g/dL (ref 12.0–15.0)
MCH: 28.4 pg (ref 26.0–34.0)
MCHC: 33.2 g/dL (ref 30.0–36.0)
Monocytes Absolute: 0.6 10*3/uL (ref 0.1–1.0)
Monocytes Relative: 7 % (ref 3–12)
Neutrophils Relative %: 67 % (ref 43–77)

## 2013-04-13 LAB — FERRITIN: Ferritin: 23 ng/mL (ref 10–291)

## 2013-04-13 LAB — SEDIMENTATION RATE: Sed Rate: 4 mm/hr (ref 0–22)

## 2013-04-13 LAB — TSH: TSH: 0.929 u[IU]/mL (ref 0.350–4.500)

## 2013-04-13 LAB — MAGNESIUM: Magnesium: 1.9 mg/dL (ref 1.5–2.5)

## 2013-04-13 LAB — C-REACTIVE PROTEIN: CRP: <0.5 mg/dL (ref <0.60)

## 2013-04-19 LAB — VITAMIN B6: Vitamin B6: 8.2 ng/mL (ref 2.1–21.7)

## 2013-04-26 ENCOUNTER — Ambulatory Visit: Payer: 59 | Admitting: Family Medicine

## 2013-04-29 ENCOUNTER — Ambulatory Visit (INDEPENDENT_AMBULATORY_CARE_PROVIDER_SITE_OTHER): Payer: 59 | Admitting: Family Medicine

## 2013-04-29 ENCOUNTER — Encounter: Payer: Self-pay | Admitting: Family Medicine

## 2013-04-29 VITALS — BP 88/58 | HR 66 | Wt 167.0 lb

## 2013-04-29 DIAGNOSIS — F329 Major depressive disorder, single episode, unspecified: Secondary | ICD-10-CM

## 2013-04-29 DIAGNOSIS — Z23 Encounter for immunization: Secondary | ICD-10-CM

## 2013-04-29 DIAGNOSIS — D509 Iron deficiency anemia, unspecified: Secondary | ICD-10-CM

## 2013-04-29 NOTE — Progress Notes (Signed)
  Subjective:    Patient ID: Kristina Black, female    DOB: 1990/11/19, 22 y.o.   MRN: [id number]  HPI Doing much better over. She is now off the Abilify. Hand cramps are better bvut still feel stiff in the AM. Still really struggling with her depression. No more joint pains. No just on Depakote and fluoxetine. jUst saw psych on Thursday. She wanted her to increase her Depakote to 1000mg . In the process of finding psych here locally. Says the restlessness is much better,    Review of Systems     Objective:   Physical Exam  Constitutional: She is oriented to person, place, and time. She appears well-developed and well-nourished.  HENT:  Head: Normocephalic and atraumatic.  Eyes: Conjunctivae and EOM are normal.  Cardiovascular: Normal rate.   Pulmonary/Chest: Effort normal.  Neurological: She is alert and oriented to person, place, and time.  Skin: Skin is dry. No pallor.  Psychiatric: She has a normal mood and affect. Her behavior is normal.          Assessment & Plan:  Mild iron def- now on iron with vit c.  This should be a good regimen for her. She's also taking a B complex which is fine. She can finish out the bottle that she has to she does not need to continue it after that. She's not deficient in any of the B12, B6 and B1.  Depression-increase her physical symptoms are much improved. I do feel like they were all secondary to the Abilify which she is completely off of at this point. She still struggling with her depression. She is scheduled to get set up with a new psychiatrist more locally in [place] so that she's not having to commute to [facility] to see her current psychiatrist.  This may be very helpful so that she can get them more frequently if needed.  Strongly encouraged her to schedule physical with Pap smear. She has never had a Pap smear and she is 22 and has been socially active in the past.

## 2013-05-27 ENCOUNTER — Other Ambulatory Visit (HOSPITAL_COMMUNITY)
Admission: RE | Admit: 2013-05-27 | Discharge: 2013-05-27 | Disposition: A | Discharge status: Home / Self Care | Payer: 59 | Source: Ambulatory Visit | Attending: Family Medicine | Admitting: Family Medicine

## 2013-05-27 ENCOUNTER — Encounter: Payer: Self-pay | Admitting: Family Medicine

## 2013-05-27 ENCOUNTER — Ambulatory Visit (INDEPENDENT_AMBULATORY_CARE_PROVIDER_SITE_OTHER): Payer: 59 | Admitting: Family Medicine

## 2013-05-27 VITALS — BP 93/61 | HR 72 | Wt 168.0 lb

## 2013-05-27 DIAGNOSIS — E039 Hypothyroidism, unspecified: Secondary | ICD-10-CM

## 2013-05-27 DIAGNOSIS — Z0000 Encounter for general adult medical examination without abnormal findings: Secondary | ICD-10-CM

## 2013-05-27 DIAGNOSIS — D509 Iron deficiency anemia, unspecified: Secondary | ICD-10-CM

## 2013-05-27 DIAGNOSIS — Z113 Encounter for screening for infections with a predominantly sexual mode of transmission: Secondary | ICD-10-CM | POA: Insufficient documentation

## 2013-05-27 DIAGNOSIS — Z124 Encounter for screening for malignant neoplasm of cervix: Secondary | ICD-10-CM

## 2013-05-27 DIAGNOSIS — Z5181 Encounter for therapeutic drug level monitoring: Secondary | ICD-10-CM

## 2013-05-27 DIAGNOSIS — Z01419 Encounter for gynecological examination (general) (routine) without abnormal findings: Secondary | ICD-10-CM | POA: Insufficient documentation

## 2013-05-27 MED ORDER — LEVOTHYROXINE SODIUM 100 MCG PO TABS: ORAL_TABLET | ORAL | Status: DC

## 2013-05-27 NOTE — Patient Instructions (Addendum)
Keep up a regular exercise program and make sure you are eating a healthy diet Try to eat 4 servings of dairy a day, or if you are lactose intolerant take a calcium with vitamin D daily.  Your vaccines are up to date.   

## 2013-05-27 NOTE — Progress Notes (Signed)
Subjective:     Kristina Black is a 22 y.o. female and is here for a comprehensive physical exam. The patient reports no problems.  Hypothyroidism - recently check levels and they were adequate. She would like to order refills for her prescription. Next  She also asked that we check certain other levels such as B12, magnesium, iron et Karie Soda.  History   Social History  . Marital Status: Single    Spouse Name: N/A    Number of Children: N/A  . Years of Education: N/A   Occupational History  . Student     [employer].     Social History Main Topics  . Smoking status: Never Smoker   . Smokeless tobacco: Never Used  . Alcohol Use: No  . Drug Use: No  . Sexual Activity: Yes     Comment: Student @ [employer],  lives in dorm with 2 roommates, some exercise, no boyfriend    Other Topics Concern  . Not on file   Social History Narrative   Student at [employer].    Health Maintenance  Topic Date Due  . Chlamydia Screening  03/16/2006  . Pap Smear  03/16/2009  . Tetanus/tdap  03/16/2010  . Influenza Vaccine  03/15/2014    The following portions of the patient's history were reviewed and updated as appropriate: allergies, current medications, past family history, past medical history, past social history, past surgical history and problem list.  Review of Systems A comprehensive review of systems was negative.   Objective:    BP 93/61  Pulse 72  Wt 168 lb (76.204 kg)  BMI 25.73 kg/m2 General appearance: alert, cooperative and appears stated age Head: Normocephalic, without obvious abnormality, atraumatic Eyes: conj clear, EOMi, PEERLA Ears: normal TM's and external ear canals both ears Nose: Nares normal. Septum midline. Mucosa normal. No drainage or sinus tenderness. Throat: lips, mucosa, and tongue normal; teeth and gums normal Neck: no adenopathy, no carotid bruit, no JVD, supple, symmetrical, trachea midline and thyroid not enlarged, symmetric, no  tenderness/mass/nodules Back: symmetric, no curvature. ROM normal. No CVA tenderness. Lungs: clear to auscultation bilaterally Breasts: normal appearance, no masses or tenderness Heart: regular rate and rhythm, S1, S2 normal, no murmur, click, rub or gallop Abdomen: soft, non-tender; bowel sounds normal; no masses,  no organomegaly Pelvic: external genitalia normal, no adnexal masses or tenderness, no cervical motion tenderness, rectovaginal septum normal, uterus normal size, shape, and consistency, vagina normal without discharge and cervix is midly erythematous Extremities: extremities normal, atraumatic, no cyanosis or edema Pulses: 2+ and symmetric Skin: Skin color, texture, turgor normal. No rashes or lesions Lymph nodes: Cervical, supraclavicular, and axillary nodes normal. Neurologic: Alert and oriented X 3, normal strength and tone. Normal symmetric reflexes. Normal coordination and gait    Assessment:    Healthy female exam.    Plan:     See After Visit Summary for Counseling Recommendations  Keep up a regular exercise program and make sure you are eating a healthy diet Try to eat 4 servings of dairy a day, or if you are lactose intolerant take a calcium with vitamin D daily.  Your vaccines are up to date.  Pap performed. Will call with results. GC and chlam ordered.    Hypothyroid-last TSH look great. Refill sent to mail order for 6 months.  I provided her a copy of labs done in August. We had already checked several things such as iron, B12 etc.  Iron deficiency-we do need to repeat a ferritin and  she has been taking over-the-counter iron and make sure her levels are starting to trend up her.  Gardasil series is complete we will get the immunization Updated.

## 2013-05-28 LAB — HEMOGLOBIN: Hemoglobin: 12.7 g/dL (ref 12.0–15.0)

## 2013-05-30 ENCOUNTER — Telehealth: Payer: Self-pay | Admitting: *Deleted

## 2013-05-30 NOTE — Telephone Encounter (Signed)
Message copied by Deno Etienne on Thu May 30, 2013  8:05 AM ------      Message from: Nani Gasser D      Created: Tue May 28, 2013  5:49 PM       Call pt: Pap is normal. Repeat in 1 years.  Neg for GC and chlam ------

## 2013-05-30 NOTE — Telephone Encounter (Signed)
Error

## 2013-08-16 ENCOUNTER — Ambulatory Visit: Payer: 59 | Admitting: Family Medicine

## 2014-03-12 ENCOUNTER — Ambulatory Visit (INDEPENDENT_AMBULATORY_CARE_PROVIDER_SITE_OTHER): Payer: 59 | Admitting: Family Medicine

## 2014-03-12 ENCOUNTER — Encounter: Payer: Self-pay | Admitting: Family Medicine

## 2014-03-12 VITALS — BP 112/77 | HR 79 | Temp 97.9°F | Ht 67.0 in | Wt 173.0 lb

## 2014-03-12 DIAGNOSIS — E039 Hypothyroidism, unspecified: Secondary | ICD-10-CM

## 2014-03-12 DIAGNOSIS — R5381 Other malaise: Secondary | ICD-10-CM

## 2014-03-12 DIAGNOSIS — R5383 Other fatigue: Secondary | ICD-10-CM

## 2014-03-12 DIAGNOSIS — R42 Dizziness and giddiness: Secondary | ICD-10-CM

## 2014-03-12 LAB — COMPLETE METABOLIC PANEL WITH GFR
ALK PHOS: 73 U/L (ref 39–117)
ALT: 16 U/L (ref 0–35)
AST: 14 U/L (ref 0–37)
Albumin: 4.2 g/dL (ref 3.5–5.2)
BUN: 13 mg/dL (ref 6–23)
CALCIUM: 9.1 mg/dL (ref 8.4–10.5)
CO2: 26 mEq/L (ref 19–32)
CREATININE: 0.92 mg/dL (ref 0.50–1.10)
Chloride: 104 mEq/L (ref 96–112)
GFR, EST NON AFRICAN AMERICAN: 89 mL/min
GFR, Est African American: >89 mL/min
GLUCOSE: 94 mg/dL (ref 70–99)
Potassium: 4.1 mEq/L (ref 3.5–5.3)
Sodium: 137 mEq/L (ref 135–145)
Total Bilirubin: 0.7 mg/dL (ref 0.2–1.2)
Total Protein: 6.9 g/dL (ref 6.0–8.3)

## 2014-03-12 LAB — CBC
HCT: 39.4 % (ref 36.0–46.0)
Hemoglobin: 13.4 g/dL (ref 12.0–15.0)
MCH: 28.3 pg (ref 26.0–34.0)
MCHC: 34 g/dL (ref 30.0–36.0)
MCV: 83.1 fL (ref 78.0–100.0)
Platelets: 214 10*3/uL (ref 150–400)
RBC: 4.74 MIL/uL (ref 3.87–5.11)
RDW: 14 % (ref 11.5–15.5)
WBC: 6 10*3/uL (ref 4.0–10.5)

## 2014-03-12 LAB — VITAMIN B12: Vitamin B-12: 370 pg/mL (ref 211–911)

## 2014-03-12 LAB — FERRITIN: FERRITIN: 24 ng/mL (ref 10–291)

## 2014-03-12 LAB — TSH: TSH: 2.928 u[IU]/mL (ref 0.350–4.500)

## 2014-03-12 NOTE — Progress Notes (Signed)
Subjective:    Patient ID: Kristina Black, female    DOB: [birth year], 23 y.o.   MRN: [id number]  HPI Hypothyroid - Says last week felt like her arms were tingling  Felt lightheaded last week. No worsening or alleviating factors. She noticed it most with change in position. It is going up or down stairs. Says this week feels better.  Did feel a little more anxious. Feels like eats pretty healthy.  She wants to run a 5 mile race. No fever. [facility] psych [facility].  If she is really stressed she will have blood with BM.  Was examined at college [facility] school clinic  and told has several fissures.  Was using an ointment and around that time is when she started to experience symptoms so she stopped it .  She does feel better this week. None of her medications have changed recently. She does have somewhat irregular sleep schedule.   Review of Systems  BP 112/77  Pulse 79  Temp(Src) 97.9 F (36.6 C) (Oral)  Ht 5\' 7"  (1.702 m)  Wt 173 lb (78.472 kg)  BMI 27.09 kg/m2    Allergies  Allergen Reactions  . Abilify [Aripiprazole] Other (See Comments)    Muscle cramping, diarrhea, blurry vision  . Penicillins     As a baby. She is unaware of the reaction.     Past Medical History  Diagnosis Date  . GOA (generalized osteoarthritis)   . Allergy   . Anxiety     No past surgical history on file.  History   Social History  . Marital Status: Single    Spouse Name: N/A    Number of Children: N/A  . Years of Education: N/A   Occupational History  . Student     [employer].     Social History Main Topics  . Smoking status: Never Smoker   . Smokeless tobacco: Never Used  . Alcohol Use: No  . Drug Use: No  . Sexual Activity: Yes     Comment: Student @ [employer],  lives in dorm with 2 roommates, some exercise, no boyfriend    Other Topics Concern  . Not on file   Social History Narrative   Student at [employer].     Family History  Problem Relation Age of Onset  .  Hypothyroidism Father   . Diabetes type II Father   . Diabetes type II Paternal Grandmother   . Hypothyroidism Maternal Grandmother     Outpatient Encounter Prescriptions as of 03/12/2014  Medication Sig  . Armodafinil (NUVIGIL) 150 MG tablet Take 150 mg by mouth daily.  . B Complex-C (SUPER B COMPLEX/VITAMIN C PO) Take 1 tablet by mouth daily.  . divalproex (DEPAKOTE ER) 500 MG 24 hr tablet Take 1,000 mg by mouth daily.  Marland Kitchen FLUoxetine (PROZAC) 20 MG tablet Take 20 mg by mouth daily.  . Iron-Vitamin C (VITRON-C) 65-125 MG TABS Take 1 tablet by mouth daily.  Marland Kitchen levothyroxine (SYNTHROID, LEVOTHROID) 100 MCG tablet TAKE 1 TABLET DAILY  . Omega-3 Fat Ac-Cholecalciferol (OMEGA ESSENTIALS/VIT D3 PO) Take 2 capsules by mouth daily.          Objective:   Physical Exam  Constitutional: She is oriented to person, place, and time. She appears well-developed and well-nourished.  HENT:  Head: Normocephalic and atraumatic.  Neck: Neck supple. No thyromegaly present.  Cardiovascular: Normal rate, regular rhythm and normal heart sounds.   Pulmonary/Chest: Effort normal and breath sounds normal.  Musculoskeletal: She exhibits  no edema.  Lymphadenopathy:    She has no cervical adenopathy.  Neurological: She is alert and oriented to person, place, and time.  Skin: Skin is warm and dry.  Psychiatric: She has a normal mood and affect. Her behavior is normal.          Assessment & Plan:  Hypothyroid - Due to recheck TSH. Last level we have on file with him as to year ago.   Dizziness/lightheadedness-it seems to have resolved this week. Unclear what may have triggered it. Than some mild dehydration. She says she's been trying to drink more fluids. None of her medications have changed recently. We will check her electrolytes as well as check for anemia.  Fatigue-will check thyroid and for anemia. Strongly encouraged her to stick with a regular sleep schedule.  Blood in stool-this is her  evaluated by another physician at her college and she was diagnosed with fissures. She stopped the appointment as she was getting because she was worried it was causing some of the lightheadedness and dizziness. She can surly call me with the name of the prescription so I can better help guide her on whether not to restart it or just discontinue it completely. Continue work on having soft stools to allow a fissures to heal." With bowel movements persist then she may need to see a GI for further evaluation.

## 2014-03-12 NOTE — Patient Instructions (Signed)
[employer] smart phone application.  

## 2014-03-13 ENCOUNTER — Encounter: Payer: Self-pay | Admitting: *Deleted

## 2014-04-11 ENCOUNTER — Encounter: Payer: Self-pay | Admitting: Family Medicine

## 2014-04-11 ENCOUNTER — Ambulatory Visit (INDEPENDENT_AMBULATORY_CARE_PROVIDER_SITE_OTHER): Payer: 59 | Admitting: Family Medicine

## 2014-04-11 ENCOUNTER — Other Ambulatory Visit: Payer: Self-pay | Admitting: Family Medicine

## 2014-04-11 VITALS — BP 97/70 | HR 87

## 2014-04-11 DIAGNOSIS — L739 Follicular disorder, unspecified: Secondary | ICD-10-CM

## 2014-04-11 DIAGNOSIS — Q828 Other specified congenital malformations of skin: Secondary | ICD-10-CM

## 2014-04-11 DIAGNOSIS — Z23 Encounter for immunization: Secondary | ICD-10-CM

## 2014-04-11 DIAGNOSIS — L738 Other specified follicular disorders: Secondary | ICD-10-CM

## 2014-04-11 DIAGNOSIS — L858 Other specified epidermal thickening: Secondary | ICD-10-CM

## 2014-04-11 MED ORDER — LEVOTHYROXINE SODIUM 100 MCG PO TABS
ORAL_TABLET | ORAL | Status: DC
Start: 2014-04-11 — End: 2015-01-01

## 2014-04-11 MED ORDER — UREA 10 % EX CREA: TOPICAL_CREAM | CUTANEOUS | Status: DC | PRN

## 2014-04-11 MED ORDER — BENZOYL PEROXIDE 10 % EX LIQD: 1.0000 "application " | Freq: Every day | CUTANEOUS | Status: DC

## 2014-04-11 MED ORDER — DOXYCYCLINE HYCLATE 100 MG PO TABS: 100.0000 mg | ORAL_TABLET | Freq: Two times a day (BID) | ORAL | Status: DC

## 2014-04-11 NOTE — Progress Notes (Signed)
   Subjective:    Patient ID: Kristina Black, female    DOB: 1990-10-06, 23 y.o.   MRN: [id number]  HPI Rash has been on her under arm, outer arms, and posterior thighs.  She says she gets red bumps under her axilla and on the posterior thighs. She'll occasionally note an actual whiteheads or pustule on the surface. Typically she can pop them or break them and then they tend to resolve within a week or so. They do not appear to be deep nodules. She does occasionally get some acne on her forehead. She's tried shaving and not shaving it doesn't really seem to make much of a difference. She feels like it's worse in the summertime. She also gets a rash on her outer arms. It feels a little bit different. She doesn't notice the pustules but just feels like it's rough in texture. She says occasionally she can squeeze it and get a little white substance out of different than the pustules. Otherwise it doesn't bother her as far as itching or irritation.  Review of Systems     Objective:   Physical Exam She has fine papular keratotic lesion with excoriations on her upper outer arms.  Under her axilla and posterior thigh she has red, inflammed follicles.   A few scattered pustules.        Assessment & Plan:  Folliculitis - I will place her on low-dose doxycycline 100 mg once a day for 1-2 months to try to clear this up. In the meantime avoid scratching or picking at the areas. Also recommend wash with benzoyl peroxide wash.  Keratosis pilaris - discussed hydrating well. Did use a good thick lotion. Also will prescribe Carmalt 10% to help remove the scale. Followup in 2 months.

## 2014-04-11 NOTE — Patient Instructions (Signed)

## 2014-05-30 ENCOUNTER — Ambulatory Visit (INDEPENDENT_AMBULATORY_CARE_PROVIDER_SITE_OTHER): Payer: 59 | Admitting: Family Medicine

## 2014-05-30 ENCOUNTER — Encounter: Payer: Self-pay | Admitting: Family Medicine

## 2014-05-30 VITALS — BP 111/62 | HR 65 | Ht 67.75 in | Wt 175.0 lb

## 2014-05-30 DIAGNOSIS — Z01419 Encounter for gynecological examination (general) (routine) without abnormal findings: Secondary | ICD-10-CM

## 2014-05-30 DIAGNOSIS — Z0000 Encounter for general adult medical examination without abnormal findings: Secondary | ICD-10-CM

## 2014-05-30 NOTE — Patient Instructions (Signed)
Keep up a regular exercise program and make sure you are eating a healthy diet Try to eat 4 servings of dairy a day, or if you are lactose intolerant take a calcium with vitamin D daily.  Your vaccines are up to date.   
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
## 2014-05-31 LAB — GC/CHLAMYDIA PROBE AMP, URINE
Chlamydia, Swab/Urine, PCR: NEGATIVE
GC PROBE AMP, URINE: NEGATIVE

## 2014-06-13 ENCOUNTER — Ambulatory Visit: Payer: 59 | Admitting: Family Medicine

## 2014-12-16 ENCOUNTER — Ambulatory Visit (INDEPENDENT_AMBULATORY_CARE_PROVIDER_SITE_OTHER): Payer: 59 | Admitting: Physician Assistant

## 2014-12-16 ENCOUNTER — Encounter: Payer: Self-pay | Admitting: Physician Assistant

## 2014-12-16 VITALS — BP 102/66 | HR 82 | Temp 98.0°F | Ht 67.75 in | Wt 177.0 lb

## 2014-12-16 DIAGNOSIS — Q829 Congenital malformation of skin, unspecified: Secondary | ICD-10-CM

## 2014-12-16 DIAGNOSIS — L858 Other specified epidermal thickening: Secondary | ICD-10-CM | POA: Insufficient documentation

## 2014-12-16 DIAGNOSIS — R2231 Localized swelling, mass and lump, right upper limb: Secondary | ICD-10-CM

## 2014-12-16 MED ORDER — DOXYCYCLINE HYCLATE 100 MG PO TABS: 100.0000 mg | ORAL_TABLET | Freq: Two times a day (BID) | ORAL | Status: DC

## 2014-12-16 MED ORDER — TRETINOIN 0.1 % EX CREA: TOPICAL_CREAM | Freq: Every day | CUTANEOUS | Status: DC

## 2014-12-16 NOTE — Progress Notes (Signed)
   Subjective:    Patient ID: Kristina Black, female    DOB: 01/27/1991, 24 y.o.   MRN: [id number]  HPI  Patient is a 24 year old female who presents to the clinic with a small lump in her right axilla that she noticed yesterday. It is not tender and does not seem to be growing. She does have a history of folliculitis. She was treating with benzyl peroxide. Has not helped yet. She recently did switch to a new deodorant that did not have aluminum in. No fever, chills, excessive swelling of the right arm., Any other abnormal masses or walks.  She does mention that she seems to have a lot of skin problems. She continues to have small bumps on the outer lateral part of her upper arms. Most of the time they are tiny and flesh-colored but they can't seem to get inflamed.    Review of Systems  All other systems reviewed and are negative.      Objective:   Physical Exam  Constitutional: She appears well-developed and well-nourished.  HENT:  Head: Normocephalic and atraumatic.  Lymphadenopathy:    She has no cervical adenopathy.  Skin:  Right upper medial axilla tiny 2mm mobile, non tender nodule. No erythema or swelling or warmth.   Left axilla had tiny papules that appear inflammed in hair follicile.   Bilateral upper outer arms tiny flesh colored papules on a base of erythema.           Assessment & Plan:  Right axilla lump- suspect a cyst or reactive lymph node. Reassured patient. Cool compresses. Will get CBC. No other lumps or masses palpated. If lymph node can take 4 or more weeks to completely resolve. Call if enlarging or becoming painful.   Keratosis pilaris- gave HO. Seems inflamed today and possible folliculitis developing in left axilla and over outer arms. Will treated with doxycycline for 10 days. Per pt benzyol peroxide not working. Given retin A to try on bilateral outer arms. If not improving please call pt request dermatology referral but will see if this is working  first.

## 2014-12-17 LAB — CBC WITH DIFFERENTIAL/PLATELET
Basophils Absolute: 0 10*3/uL (ref 0.0–0.1)
Basophils Relative: 0 % (ref 0–1)
EOS ABS: 0.1 10*3/uL (ref 0.0–0.7)
EOS PCT: 1 % (ref 0–5)
HEMATOCRIT: 38.8 % (ref 36.0–46.0)
Hemoglobin: 13.1 g/dL (ref 12.0–15.0)
LYMPHS PCT: 34 % (ref 12–46)
Lymphs Abs: 2.4 10*3/uL (ref 0.7–4.0)
MCH: 27.3 pg (ref 26.0–34.0)
MCHC: 33.8 g/dL (ref 30.0–36.0)
MCV: 81 fL (ref 78.0–100.0)
MONOS PCT: 7 % (ref 3–12)
MPV: 12.6 fL — ABNORMAL HIGH (ref 8.6–12.4)
Monocytes Absolute: 0.5 10*3/uL (ref 0.1–1.0)
Neutro Abs: 4.2 10*3/uL (ref 1.7–7.7)
Neutrophils Relative %: 58 % (ref 43–77)
Platelets: 219 10*3/uL (ref 150–400)
RBC: 4.79 MIL/uL (ref 3.87–5.11)
RDW: 14.1 % (ref 11.5–15.5)
WBC: 7.2 10*3/uL (ref 4.0–10.5)

## 2014-12-18 DIAGNOSIS — R2230 Localized swelling, mass and lump, unspecified upper limb: Secondary | ICD-10-CM | POA: Insufficient documentation

## 2015-01-01 ENCOUNTER — Encounter: Payer: Self-pay | Admitting: Family Medicine

## 2015-01-01 ENCOUNTER — Ambulatory Visit (INDEPENDENT_AMBULATORY_CARE_PROVIDER_SITE_OTHER): Payer: 59 | Admitting: Family Medicine

## 2015-01-01 VITALS — BP 94/59 | HR 67 | Ht 68.0 in | Wt 178.0 lb

## 2015-01-01 DIAGNOSIS — E039 Hypothyroidism, unspecified: Secondary | ICD-10-CM | POA: Diagnosis not present

## 2015-01-01 DIAGNOSIS — L739 Follicular disorder, unspecified: Secondary | ICD-10-CM

## 2015-01-01 LAB — TSH: TSH: 1.611 u[IU]/mL (ref 0.350–4.500)

## 2015-01-01 MED ORDER — CLINDAMYCIN PHOSPHATE 1 % EX LOTN: TOPICAL_LOTION | Freq: Every day | CUTANEOUS | Status: DC

## 2015-01-01 MED ORDER — LEVOTHYROXINE SODIUM 100 MCG PO TABS: ORAL_TABLET | ORAL | Status: DC

## 2015-01-01 NOTE — Progress Notes (Signed)
   Subjective:    Patient ID: Kristina Black, female    DOB: 08/19/1990, 24 y.o.   MRN: [id number]  HPI Follow-up hypothyroidism-no recent weight changes. No skin changes. She has gained about 2 lbs.  She feels like she eats healthy.  Hair is more dry.    Folliculitis - Says was using the Retin-A on her undearms bur was causing irritation and skin peeling.    Review of Systems     Objective:   Physical Exam  Constitutional: She is oriented to person, place, and time. She appears well-developed and well-nourished.  HENT:  Head: Normocephalic and atraumatic.  Neck: Neck supple. No thyromegaly present.  Cardiovascular: Normal rate, regular rhythm and normal heart sounds.   Pulmonary/Chest: Effort normal and breath sounds normal.  Lymphadenopathy:    She has no cervical adenopathy.  Neurological: She is alert and oriented to person, place, and time.  Skin: Skin is warm and dry.  Small pustule under the left axilla.   Psychiatric: She has a normal mood and affect. Her behavior is normal.          Assessment & Plan:  Hypothyroid- doing well overall but has noticed some changes. Will recheck thyorid.  F/U in 6 months.   Folliculitis - will try clindamycin lotion so the benzoyl peroxide and the retin-A were to irritating.    Still hasn't found a job.

## 2015-01-02 NOTE — Progress Notes (Signed)
Quick Note:  All labs are normal. ______ 

## 2015-01-08 MED ORDER — LEVOTHYROXINE SODIUM 100 MCG PO TABS: ORAL_TABLET | ORAL | Status: DC

## 2015-01-08 NOTE — Addendum Note (Signed)
Addended by: Deno EtienneBARKLEY, Chizara Mena L on: 01/08/2015 03:58 PM   Modules accepted: Orders

## 2015-07-01 ENCOUNTER — Ambulatory Visit (INDEPENDENT_AMBULATORY_CARE_PROVIDER_SITE_OTHER): Payer: 59 | Admitting: Physician Assistant

## 2015-07-01 VITALS — BP 114/68 | HR 80 | Temp 98.2°F | Wt 160.0 lb

## 2015-07-01 DIAGNOSIS — Z23 Encounter for immunization: Secondary | ICD-10-CM

## 2015-07-01 DIAGNOSIS — Z111 Encounter for screening for respiratory tuberculosis: Secondary | ICD-10-CM | POA: Diagnosis not present

## 2015-07-01 NOTE — Progress Notes (Signed)
Patient walked into clinic today to obtain the [employer] immunization registry for a new job. Patient employer also requires an updated flu shot and PPD. Patient was added onto the nurse schedule for completion.  Patient was given a flu shot questionnaire prior to administration of immunization. All questions were answered no. Patient tolerated injection of flu immunization in Left deltoid well, with no immediate complications. An information pamphlet regarding flu shot immunization and frequently asked questions was given to Patient prior to leaving clinic. Patient tolerated PPD placement in left foream well with no immediate complications. Pt was advised to return to clinic on Friday for PPD read. At that time, we will print out proof of flu shot administration and PPD results. Verbalized understanding, advised to contact our office with any questions/concerns.

## 2015-07-03 LAB — TB SKIN TEST
Induration: 0 mm
TB Skin Test: NEGATIVE

## 2015-08-03 ENCOUNTER — Other Ambulatory Visit: Payer: Self-pay

## 2015-08-03 DIAGNOSIS — E039 Hypothyroidism, unspecified: Secondary | ICD-10-CM

## 2015-08-03 MED ORDER — LEVOTHYROXINE SODIUM 100 MCG PO TABS: ORAL_TABLET | ORAL | Status: DC

## 2015-08-24 ENCOUNTER — Ambulatory Visit: Payer: 59 | Admitting: Family Medicine

## 2015-08-31 ENCOUNTER — Encounter: Payer: Self-pay | Admitting: Family Medicine

## 2015-08-31 ENCOUNTER — Ambulatory Visit (INDEPENDENT_AMBULATORY_CARE_PROVIDER_SITE_OTHER): Payer: BLUE CROSS/BLUE SHIELD | Admitting: Family Medicine

## 2015-08-31 VITALS — BP 117/77 | HR 75 | Wt 155.0 lb

## 2015-08-31 DIAGNOSIS — E039 Hypothyroidism, unspecified: Secondary | ICD-10-CM | POA: Diagnosis not present

## 2015-08-31 DIAGNOSIS — F411 Generalized anxiety disorder: Secondary | ICD-10-CM

## 2015-08-31 DIAGNOSIS — F39 Unspecified mood [affective] disorder: Secondary | ICD-10-CM

## 2015-08-31 DIAGNOSIS — R634 Abnormal weight loss: Secondary | ICD-10-CM

## 2015-08-31 MED ORDER — LEVOTHYROXINE SODIUM 100 MCG PO TABS: ORAL_TABLET | ORAL | Status: DC

## 2015-08-31 NOTE — Progress Notes (Signed)
Subjective:    Patient ID: Kristina Black, female    DOB: 1991/01/02, 25 y.o.   MRN: [id number]  HPI Started her first full time job about 6 weeks ago and has felt more fatigue.  She is working in [employer] 2nd shift.  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  She has been more stressed.  She has had moe acne recently.  She has felt the texture of her skin has changed.  Has been having more HA and some breathing issues  Says plans to change the air filters in her home.  She has been sleeping 7-9 hours mot nights.  Not snoring.  Stopped psych med in summer of 2015.  Has been waking up with high anxiety and shaking.  Work place has been hard, feels they gossip a lot.  She has lost about 20 lbs in the last month.  She plans on starting to work out. She thinks she might benefit from getting back in with a therapist/counselor again. Also been craving meat. Also note she did have a history of an eating disorder when she was in high school but has not had any issues since then. She has had recent thoughts of wanting to harm herself. She rates her depression symptoms as somewhat difficult and her anxiety symptoms as extremely difficult.  Hypothyroid = + skin changes.  + fatigue. + Mentally foggy.  She also requests a nutrition referral because of the recent weight loss she. She wants to make sure that she's not under eating or restricting. While at work she eats 1 meal into snacks but is not having breakfast or an evening meal.   Review of Systems     Objective:   Physical Exam        Assessment & Plan:  Hypothyroid - due to recheck TSH. Certainly some of the physical symptoms she has been expressing could be related to her thyroid gland.  All other medications such as vitamins and supplements need to be taken 4 hours away from the thyroid medication.  Mood D/O uspecified/Generalized anxiety disorder-PHQ 9 score of 15 and dad 7 score of 19. We discussed that there are several  options at this point. I think she has enough symptoms to warrant considering restarting medication. She really wants to hold off on that and mainly focus on lifestyle which I think is reasonable for the short-term. She's going to reschedule her life so she can try to getting regular exercise and eat healthy and have some more personal time for herself to take better care of herself. She is also hoping to switch from second shift first shift at work and thinks that that will help. I do think it stressful to work and to the mental health profession especially with her prior history of mental health concerns including prior eating disorder and depression and anxiety. I do think it'll be a fantastic idea for her to get in with a new therapist. Especially now that she has new insurance.  Abnormal weight loss - She is also requesting a nutrition referral to help her make sure that she is eating a healthy balance and making sure that she is not under eating or restricting. Actually think that that's a fantastic idea. We will place referral and try to get her scheduled in [place] close to where she works. We did discuss trying to eat more regularly which I know can be challenging as her thyroid medication has to be taken separately. If she does  need to eat closer to the time that she takes her thyroid medication and she just seems to be very consistent and then we can always adjust her dose if we need to.

## 2015-09-01 LAB — CBC WITH DIFFERENTIAL/PLATELET
Basophils Absolute: 0 10*3/uL (ref 0.0–0.1)
Basophils Relative: 0 % (ref 0–1)
Eosinophils Absolute: 0 10*3/uL (ref 0.0–0.7)
Eosinophils Relative: 0 % (ref 0–5)
HEMATOCRIT: 40.7 % (ref 36.0–46.0)
HEMOGLOBIN: 13.9 g/dL (ref 12.0–15.0)
LYMPHS ABS: 2.4 10*3/uL (ref 0.7–4.0)
LYMPHS PCT: 24 % (ref 12–46)
MCH: 28.8 pg (ref 26.0–34.0)
MCHC: 34.2 g/dL (ref 30.0–36.0)
MCV: 84.3 fL (ref 78.0–100.0)
MONO ABS: 0.6 10*3/uL (ref 0.1–1.0)
MPV: 11.4 fL (ref 8.6–12.4)
Monocytes Relative: 6 % (ref 3–12)
NEUTROS ABS: 7 10*3/uL (ref 1.7–7.7)
Neutrophils Relative %: 70 % (ref 43–77)
Platelets: 228 10*3/uL (ref 150–400)
RBC: 4.83 MIL/uL (ref 3.87–5.11)
RDW: 13.7 % (ref 11.5–15.5)
WBC: 10 10*3/uL (ref 4.0–10.5)

## 2015-09-01 LAB — COMPLETE METABOLIC PANEL WITH GFR
ALBUMIN: 4.3 g/dL (ref 3.6–5.1)
ALK PHOS: 63 U/L (ref 33–115)
ALT: 13 U/L (ref 6–29)
AST: 14 U/L (ref 10–30)
BUN: 11 mg/dL (ref 7–25)
CALCIUM: 9.3 mg/dL (ref 8.6–10.2)
CO2: 26 mmol/L (ref 20–31)
CREATININE: 0.77 mg/dL (ref 0.50–1.10)
Chloride: 105 mmol/L (ref 98–110)
GFR, Est African American: >89 mL/min (ref >=60)
GFR, Est Non African American: >89 mL/min (ref >=60)
Glucose, Bld: 75 mg/dL (ref 65–99)
POTASSIUM: 4.2 mmol/L (ref 3.5–5.3)
Sodium: 140 mmol/L (ref 135–146)
Total Bilirubin: 0.7 mg/dL (ref 0.2–1.2)
Total Protein: 6.6 g/dL (ref 6.1–8.1)

## 2015-09-01 LAB — FERRITIN: Ferritin: 58 ng/mL (ref 10–291)

## 2015-09-01 LAB — TSH: TSH: 1.041 u[IU]/mL (ref 0.350–4.500)

## 2015-09-07 ENCOUNTER — Other Ambulatory Visit: Payer: Self-pay | Admitting: Family Medicine

## 2015-09-07 DIAGNOSIS — E039 Hypothyroidism, unspecified: Secondary | ICD-10-CM

## 2015-09-07 MED ORDER — LEVOTHYROXINE SODIUM 100 MCG PO TABS
ORAL_TABLET | ORAL | Status: DC
Start: 2015-09-07 — End: 2016-05-23

## 2016-03-24 ENCOUNTER — Ambulatory Visit (INDEPENDENT_AMBULATORY_CARE_PROVIDER_SITE_OTHER): Payer: 59 | Admitting: Family Medicine

## 2016-03-24 ENCOUNTER — Encounter: Payer: Self-pay | Admitting: Family Medicine

## 2016-03-24 VITALS — BP 96/55 | HR 76 | Resp 18 | Wt 146.0 lb

## 2016-03-24 DIAGNOSIS — R5383 Other fatigue: Secondary | ICD-10-CM

## 2016-03-24 NOTE — Progress Notes (Signed)
Subjective:    [id number]  HPI: Patient is here today complaining of severe fatigue. She said starting about 6 months ago she started to notice that she was breaking out with acne across her forehead. She says this is really unusual for her. Even as a teenager she rarely had a skin break out. She also noticed that her hair was a lot more brittle and so recently got a cut to remove the ends. She says that her menses are still very regularly though they are heavy the first 3 days and sometimes passes clots. She denies any special diet such as being vegetarian. She did report that she had a virus that lasted about 2 weeks at the beginning of June with sore throat and fever and congestion. She's felt more fatigued over the last couple of months. 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 She is now back home and trying to find a job. She is college educated. She has been having more frequent headaches that even wake her up at times. She also admits that she's been very down and sad. She was seeing a therapist pretty regularly until she lost her insurance through work. She is now on her parents insurance until she returns 13.  She does complain of feeling down and depressed nearly every day and difficulty with sleep. She feels constantly tired and feels bad about herself every day. She has trouble concentrating more than half the days and has had thoughts of being better off dead more than half the days.  BP (!) 96/55 (BP Location: Left Arm, Patient Position: Sitting, Cuff Size: Normal)   Pulse 76   Resp 18   Wt 146 lb (66.2 kg)   SpO2 100%   BMI 22.20 kg/m     Allergies  Allergen Reactions  . Abilify [Aripiprazole] Other (See Comments)    Muscle cramping, diarrhea, blurry vision  . Penicillins     As a baby. She is unaware of the reaction.     Past Medical History:   Diagnosis Date  . Allergy   . Anxiety   . GOA (generalized osteoarthritis)     No past surgical history on file.  Social History   Social History  . Marital status: Single    Spouse name: N/A  . Number of children: N/A  . Years of education: N/A   Occupational History  . unemployed     Social History Main Topics  . Smoking status: Never Smoker  . Smokeless tobacco: Never Used  . Alcohol use No  . Drug use: No  . Sexual activity: Yes   Other Topics Concern  . Not on file   Social History Narrative   Currently out of work after college. She does exercise.     Family History  Problem Relation Age of Onset  . Hypothyroidism Father   . Diabetes type II Father   . Diabetes type II Paternal Grandmother   . Hypothyroidism Maternal Grandmother     Outpatient Encounter Prescriptions as of 03/24/2016  Medication Sig  . levothyroxine (SYNTHROID, LEVOTHROID) 100 MCG tablet TAKE 1 TABLET DAILY   No facility-administered encounter medications on file as of 03/24/2016.         Review of Systems: No fevers, chills, night sweats, weight loss, chest pain, or shortness of breath.   Objective:  General: Well Developed, well nourished, and in no acute distress.  Neuro: Alert and oriented x3, extra-ocular muscles intact, sensation grossly intact. Gait is normal.   HEENT: Normocephalic, atraumatic, EOMI, PEERLA, no cerv LN, TMs and canals are clear bilat.  OP is clear.    Skin: Warm and dry, no rashes. Cardiac: Regular rate and rhythm, no murmurs rubs or gallops, no lower extremity edema.  Respiratory: Clear to auscultation bilaterally. Not using accessory muscles, speaking in full sentences. Abd: Soft , NT, no organomegaly Ext: No LE edema    Impression and Recommendations:   Hypothyroidism-due to recheck thyroid level. She's currently on 100 g daily. It may be that we need to adjust her dose. Next  Extreme fatigue-unclear etiology. Again could be an irregularity of  the thyroid. We'll also check for B12 deficiency and vitamin D deficiency.  Depression-she's not currently being treated for depression or anxiety. PHQ 9 score of 22.  Discussed checking with her insurance to see if they will cover behavioral health since she now is on her parents insurance. Offered to set her up with her psychiatrist and therapist downstairs at that's convenient for her or she can even consider getting back in with her previous therapist with whom she had worked for quite some time. Patient says she will check into it. She rates her symptoms as extremely difficult.

## 2016-03-25 LAB — COMPLETE METABOLIC PANEL WITH GFR
ALT: 12 U/L (ref 6–29)
AST: 14 U/L (ref 10–30)
Albumin: 4.3 g/dL (ref 3.6–5.1)
Alkaline Phosphatase: 64 U/L (ref 33–115)
BILIRUBIN TOTAL: 0.7 mg/dL (ref 0.2–1.2)
BUN: 8 mg/dL (ref 7–25)
CHLORIDE: 104 mmol/L (ref 98–110)
CO2: 25 mmol/L (ref 20–31)
CREATININE: 0.89 mg/dL (ref 0.50–1.10)
Calcium: 9.5 mg/dL (ref 8.6–10.2)
GFR, Est Non African American: >89 mL/min (ref >=60)
GLUCOSE: 85 mg/dL (ref 65–99)
Potassium: 4.1 mmol/L (ref 3.5–5.3)
Sodium: 142 mmol/L (ref 135–146)
TOTAL PROTEIN: 6.8 g/dL (ref 6.1–8.1)

## 2016-03-25 LAB — VITAMIN D 25 HYDROXY (VIT D DEFICIENCY, FRACTURES): Vit D, 25-Hydroxy: 27 ng/mL — ABNORMAL LOW (ref 30–100)

## 2016-03-25 LAB — CBC WITH DIFFERENTIAL/PLATELET
BASOS ABS: 0 {cells}/uL (ref 0–200)
BASOS PCT: 0 %
EOS ABS: 110 {cells}/uL (ref 15–500)
Eosinophils Relative: 2 %
HEMATOCRIT: 40.7 % (ref 35.0–45.0)
Hemoglobin: 13.7 g/dL (ref 11.7–15.5)
LYMPHS PCT: 31 %
Lymphs Abs: 1705 cells/uL (ref 850–3900)
MCH: 28.8 pg (ref 27.0–33.0)
MCHC: 33.7 g/dL (ref 32.0–36.0)
MCV: 85.5 fL (ref 80.0–100.0)
MONO ABS: 385 {cells}/uL (ref 200–950)
MONOS PCT: 7 %
MPV: 11.4 fL (ref 7.5–12.5)
NEUTROS PCT: 60 %
Neutro Abs: 3300 cells/uL (ref 1500–7800)
PLATELETS: 207 10*3/uL (ref 140–400)
RBC: 4.76 MIL/uL (ref 3.80–5.10)
RDW: 14.4 % (ref 11.0–15.0)
WBC: 5.5 10*3/uL (ref 3.8–10.8)

## 2016-03-25 LAB — VITAMIN B12: VITAMIN B 12: 444 pg/mL (ref 200–1100)

## 2016-03-25 LAB — TSH: TSH: 0.68 m[IU]/L

## 2016-05-23 ENCOUNTER — Other Ambulatory Visit: Payer: Self-pay | Admitting: Family Medicine

## 2016-05-23 DIAGNOSIS — E039 Hypothyroidism, unspecified: Secondary | ICD-10-CM

## 2016-07-15 ENCOUNTER — Ambulatory Visit (INDEPENDENT_AMBULATORY_CARE_PROVIDER_SITE_OTHER): Payer: 59 | Admitting: Sports Medicine

## 2016-07-15 ENCOUNTER — Ambulatory Visit (INDEPENDENT_AMBULATORY_CARE_PROVIDER_SITE_OTHER): Payer: 59

## 2016-07-15 ENCOUNTER — Encounter: Payer: Self-pay | Admitting: Sports Medicine

## 2016-07-15 DIAGNOSIS — X501XXA Overexertion from prolonged static or awkward postures, initial encounter: Secondary | ICD-10-CM

## 2016-07-15 DIAGNOSIS — S99921A Unspecified injury of right foot, initial encounter: Secondary | ICD-10-CM

## 2016-07-15 MED ORDER — IBUPROFEN 800 MG PO TABS: 800.0000 mg | ORAL_TABLET | Freq: Three times a day (TID) | ORAL | 2 refills | Status: DC | PRN

## 2016-07-15 MED ORDER — TRAMADOL HCL 50 MG PO TABS: ORAL_TABLET | ORAL | 0 refills | Status: DC

## 2016-07-15 NOTE — Assessment & Plan Note (Signed)
Pain at the base of the fourth metatarsal likely avulsion fracture. X-rays, ibuprofen. Return to see me in 2 weeks. If she does have a fracture we will bring her back to place a postop shoe.

## 2016-07-15 NOTE — Progress Notes (Signed)
   Subjective:    I'm seeing this patient as a consultation for:  Dr. Nani Gasseratherine Metheney  CC: Right foot injury  HPI: Yesterday this pleasant 25 year old female inverted her right foot, she had immediate pain, swelling, bruising over the dorsum, now has pain with weightbearing, moderate, persistent without radiation.  Past medical history:  Negative.  See flowsheet/record as well for more information.  Surgical history: Negative.  See flowsheet/record as well for more information.  Family history: Negative.  See flowsheet/record as well for more information.  Social history: Negative.  See flowsheet/record as well for more information.  Allergies, and medications have been entered into the medical record, reviewed, and no changes needed.   Review of Systems: No headache, visual changes, nausea, vomiting, diarrhea, constipation, dizziness, abdominal pain, skin rash, fevers, chills, night sweats, weight loss, swollen lymph nodes, body aches, joint swelling, muscle aches, chest pain, shortness of breath, mood changes, visual or auditory hallucinations.   Objective:   General: Well Developed, well nourished, and in no acute distress.  Neuro/Psych: Alert and oriented x3, extra-ocular muscles intact, able to move all 4 extremities, sensation grossly intact. Skin: Warm and dry, no rashes noted.  Respiratory: Not using accessory muscles, speaking in full sentences, trachea midline.  Cardiovascular: Pulses palpable, no extremity edema. Abdomen: Does not appear distended. Right Foot: No visible erythema or swelling. Range of motion is full in all directions. Strength is 5/5 in all directions. No hallux valgus. No pes cavus or pes planus. No abnormal callus noted. No pain over the navicular prominence, or base of fifth metatarsal. No tenderness to palpation of the calcaneal insertion of plantar fascia. No pain at the Achilles insertion. No pain over the calcaneal bursa. No pain of the  retrocalcaneal bursa. Tender palpation with bruising at the base of the fourth metatarsal No hallux rigidus or limitus. No tenderness palpation over interphalangeal joints. No pain with compression of the metatarsal heads. Neurovascularly intact distally.  Impression and Recommendations:   This case required medical decision making of moderate complexity.  Right foot injury Pain at the base of the fourth metatarsal likely avulsion fracture. X-rays, ibuprofen. Return to see me in 2 weeks. If she does have a fracture we will bring her back to place a postop shoe.

## 2016-09-22 ENCOUNTER — Telehealth: Payer: Self-pay | Admitting: Family Medicine

## 2016-09-22 NOTE — Telephone Encounter (Signed)
Left vm about flu shot. °

## 2017-01-13 ENCOUNTER — Encounter: Payer: 59 | Admitting: Family Medicine

## 2017-01-17 ENCOUNTER — Ambulatory Visit (INDEPENDENT_AMBULATORY_CARE_PROVIDER_SITE_OTHER): Payer: 59 | Admitting: Family Medicine

## 2017-01-17 ENCOUNTER — Other Ambulatory Visit (HOSPITAL_COMMUNITY)
Admission: RE | Admit: 2017-01-17 | Discharge: 2017-01-17 | Disposition: A | Discharge status: Home / Self Care | Payer: 59 | Source: Ambulatory Visit | Attending: Family Medicine | Admitting: Family Medicine

## 2017-01-17 ENCOUNTER — Encounter: Payer: Self-pay | Admitting: Family Medicine

## 2017-01-17 VITALS — BP 102/57 | HR 72 | Ht 67.72 in | Wt 151.0 lb

## 2017-01-17 DIAGNOSIS — F411 Generalized anxiety disorder: Secondary | ICD-10-CM | POA: Diagnosis not present

## 2017-01-17 DIAGNOSIS — L858 Other specified epidermal thickening: Secondary | ICD-10-CM

## 2017-01-17 DIAGNOSIS — E039 Hypothyroidism, unspecified: Secondary | ICD-10-CM | POA: Diagnosis not present

## 2017-01-17 DIAGNOSIS — Z0000 Encounter for general adult medical examination without abnormal findings: Secondary | ICD-10-CM | POA: Insufficient documentation

## 2017-01-17 DIAGNOSIS — L720 Epidermal cyst: Secondary | ICD-10-CM | POA: Diagnosis not present

## 2017-01-17 NOTE — Progress Notes (Signed)
Subjective:     Kristina Black is a 26 y.o. female and is here for a comprehensive physical exam. The patient reports problems - couple of concerns.  She does have a couple of dermatologic concerns. She still gets the bumps on her outer arms and thighs. She does tend to pick at them and scratch at them. She also recently at the end of her menstrual cycle or a pad for most of the day because she didn't have time to run to the bathroom and change it. Afterwards she got a lump that was tender and swollen in the left labia but has been gradually getting smaller on its own. It never drained and it's no longer painful. She also has a couple of lesions on the mons pubis area that she would like me to look at as well. She says they've been there for about a year and sometimes she is able to squeeze some white colored drainage out of them. She is worried about potential for STD. No recent new sexual partners but she does have a history be sexually active. She is due for her Pap smear as well.  She also wanted to discuss her anxiety. She has a new job working at the [employer] part-time and a second part-time job. She feels like her social anxiety has been really heightened particularly in the last 6 months. She starts to feel like people are watching her and watching her for mistakes and that she has been going to fail and then she's not able to perform her job to the best of her ability. She has had problems with this in the past.  Social History   Social History  . Marital status: Single    Spouse name: N/A  . Number of children: N/A  . Years of education: N/A   Occupational History  . unemployed     Social History Main Topics  . Smoking status: Never Smoker  . Smokeless tobacco: Never Used  . Alcohol use No  . Drug use: No  . Sexual activity: Yes   Other Topics Concern  . Not on file   Social History Narrative   Currently out of work after college. She does exercise.    Health Maintenance   Topic Date Due  . PAP SMEAR  05/27/2016  . INFLUENZA VACCINE  11/13/2018 (Originally 03/15/2017)  . TETANUS/TDAP  04/07/2018  . HIV Screening  Completed    The following portions of the patient's history were reviewed and updated as appropriate: allergies, current medications, past family history, past medical history, past social history, past surgical history and problem list.  Review of Systems A comprehensive review of systems was negative.   Objective:    BP (!) 102/57   Pulse 72   Ht 5' 7.72" (1.72 m)   Wt 151 lb (68.5 kg)   LMP 01/08/2017 (Approximate)   SpO2 100%   BMI 23.15 kg/m  General appearance: alert, cooperative and appears stated age Head: Normocephalic, without obvious abnormality, atraumatic Eyes: conj clear, EOMI, PEERLA Ears: normal TM's and external ear canals both ears Nose: Nares normal. Septum midline. Mucosa normal. No drainage or sinus tenderness. Throat: lips, mucosa, and tongue normal; teeth and gums normal Neck: no adenopathy, no carotid bruit, no JVD, supple, symmetrical, trachea midline and thyroid not enlarged, symmetric, no tenderness/mass/nodules Back: symmetric, no curvature. ROM normal. No CVA tenderness. Lungs: clear to auscultation bilaterally Breasts: normal appearance, no masses or tenderness Heart: regular rate and rhythm, S1, S2 normal, no  murmur, click, rub or gallop Abdomen: soft, non-tender; bowel sounds normal; no masses,  no organomegaly Pelvic: cervix normal in appearance, external genitalia normal, no adnexal masses or tenderness, no cervical motion tenderness, rectovaginal septum normal, uterus normal size, shape, and consistency and vagina normal without discharge Extremities: extremities normal, atraumatic, no cyanosis or edema Pulses: 2+ and symmetric Skin: Skin color, texture, turgor normal. No rashes or lesions or keratosis pilaris Lymph nodes: Cervical, supraclavicular, and axillary nodes normal. Neurologic: Alert and  oriented X 3, normal strength and tone. Normal symmetric reflexes. Normal coordination and gait    Assessment:    Healthy female exam.      Plan:     See After Visit Summary for Counseling Recommendations   Keep up a regular exercise program and make sure you are eating a healthy diet Try to eat 4 servings of dairy a day, or if you are lactose intolerant take a calcium with vitamin D daily.  Your vaccines are up to date.  STD screening recommended that she was previously sexually active.   Social anxiety - We discussed options. She's not particularly interested in going back on psychiatric medication. She has been off of this for about 3 years. I definitely think something like a beta blocker would be absolutely reasonable in addition to maybe when necessary hydroxyzine. Explained that these are not essentially psychiatric drugs that can actually help with symptoms. She says for now she wants to see how the summer goes that she'll certainly call me back if she wants to try those medications.  Sebaceous cyst/epidermal cyst on the mons pubis area-explained diagnosis and gave reassurance. Certainly these can be excised at any point if she would like to have that done by dermatology.  Keratosis pilaris-again gave reassurance. We previously discussed this diagnosis. Again recommended a lotion that contains a keratolytic such as Cereve. Recommend avoid picking at them.  Hypothyroidism-she reports she is taking her medication has not really had any recent changes in weight skin or hair. She does need refills on her medication and is due for a recheck on her TSH.

## 2017-01-18 LAB — RPR

## 2017-01-18 LAB — COMPLETE METABOLIC PANEL WITH GFR
ALT: 12 U/L (ref 6–29)
AST: 16 U/L (ref 10–30)
Albumin: 4.3 g/dL (ref 3.6–5.1)
Alkaline Phosphatase: 67 U/L (ref 33–115)
BILIRUBIN TOTAL: 0.4 mg/dL (ref 0.2–1.2)
BUN: 12 mg/dL (ref 7–25)
CO2: 23 mmol/L (ref 20–31)
Calcium: 9.1 mg/dL (ref 8.6–10.2)
Chloride: 105 mmol/L (ref 98–110)
Creat: 0.88 mg/dL (ref 0.50–1.10)
Glucose, Bld: 82 mg/dL (ref 65–99)
Potassium: 4.2 mmol/L (ref 3.5–5.3)
Sodium: 139 mmol/L (ref 135–146)
TOTAL PROTEIN: 6.6 g/dL (ref 6.1–8.1)

## 2017-01-18 LAB — TSH: TSH: 1.87 m[IU]/L

## 2017-01-18 LAB — LIPID PANEL W/REFLEX DIRECT LDL
Cholesterol: 135 mg/dL (ref <200)
HDL: 58 mg/dL (ref >50)
LDL-CHOLESTEROL: 57 mg/dL
NON-HDL CHOLESTEROL (CALC): 77 mg/dL (ref <130)
TRIGLYCERIDES: 117 mg/dL (ref <150)
Total CHOL/HDL Ratio: 2.3 Ratio (ref <5.0)

## 2017-01-18 LAB — HIV ANTIBODY (ROUTINE TESTING W REFLEX): HIV 1&2 Ab, 4th Generation: NONREACTIVE

## 2017-01-20 LAB — CYTOLOGY - PAP
Chlamydia: NEGATIVE
Diagnosis: NEGATIVE
NEISSERIA GONORRHEA: NEGATIVE

## 2017-01-20 NOTE — Progress Notes (Signed)
Call patient: Your Pap smear is normal. Repeat in 3 years. Negative for gonorrhea and chlamydia.

## 2017-01-23 ENCOUNTER — Other Ambulatory Visit: Payer: Self-pay

## 2017-01-23 DIAGNOSIS — E039 Hypothyroidism, unspecified: Secondary | ICD-10-CM

## 2017-01-23 MED ORDER — LEVOTHYROXINE SODIUM 100 MCG PO TABS: 100.0000 ug | ORAL_TABLET | Freq: Every day | ORAL | 0 refills | Status: DC

## 2017-02-23 ENCOUNTER — Encounter: Payer: Self-pay | Admitting: Family Medicine

## 2017-02-23 ENCOUNTER — Ambulatory Visit (INDEPENDENT_AMBULATORY_CARE_PROVIDER_SITE_OTHER): Payer: 59 | Admitting: Family Medicine

## 2017-02-23 VITALS — BP 111/71 | HR 71 | Ht 67.0 in | Wt 154.0 lb

## 2017-02-23 DIAGNOSIS — F411 Generalized anxiety disorder: Secondary | ICD-10-CM | POA: Diagnosis not present

## 2017-02-23 DIAGNOSIS — F39 Unspecified mood [affective] disorder: Secondary | ICD-10-CM | POA: Diagnosis not present

## 2017-02-23 NOTE — Progress Notes (Signed)
Subjective:    Patient ID: Djuana Littleton, female    DOB: May 21, 1991, 26 y.o.   MRN: [id number]  HPI 26 year old female comes in today to discuss her mood. I actually saw her about 5 weeks ago for a complete physical exam. At that point she was particularly distressed by some social anxiety that was making him a little bit more difficult for her work. It wasn't in every situation but certain situations. She did feel like she liked her work environment overall and she felt like her boss etc. very supportive. More recently she has started to just feel very anxious and overwhelmed. She's also starting to have suicidal thoughts. In fact on Saturday she felt like she really did not want to be here. Some friends reached out and she called to get an appointment at [facility] yesterday. Unfortunately when she got there there was another patient who was in crisis and so they had asked her to reschedule. This was very distressing and upsetting to her because she felt like it was very difficult for her to reach out for help and then when she did she was being turned away. Patient became very tearful in the room today talking about it. She did eventually see one of the therapist/counselor's there he recommended that she do a more comprehensive outpatient program and they were able to get her scheduled starting on Tuesday, July 24. She is a little bit nervous because the name on the paperwork is a man's name. And she really is very uncomfortable with working directly with a female for her psychiatric problems.   Review of Systems  BP 111/71   Pulse 71   Ht 5\' 7"  (1.702 m)   Wt 154 lb (69.9 kg)   BMI 24.12 kg/m     Allergies  Allergen Reactions  . Abilify [Aripiprazole] Other (See Comments)    Muscle cramping, diarrhea, blurry vision,disorientation,heart racing,loss of dexterity in fingers/hands  . Penicillins     As a baby. She is unaware of the reaction.     Past Medical History:   Diagnosis Date  . Allergy   . Anxiety   . GOA (generalized osteoarthritis)     No past surgical history on file.  Social History   Social History  . Marital status: Single    Spouse name: N/A  . Number of children: N/A  . Years of education: N/A   Occupational History  . unemployed     Social History Main Topics  . Smoking status: Never Smoker  . Smokeless tobacco: Never Used  . Alcohol use No  . Drug use: No  . Sexual activity: Yes   Other Topics Concern  . Not on file   Social History Narrative   Currently out of work after college. She does exercise.     Family History  Problem Relation Age of Onset  . Hypothyroidism Father   . Diabetes type II Father   . Diabetes type II Paternal Grandmother   . Hypothyroidism Maternal Grandmother     Outpatient Encounter Prescriptions as of 02/23/2017  Medication Sig  . levothyroxine (SYNTHROID, LEVOTHROID) 100 MCG tablet Take 1 tablet (100 mcg total) by mouth daily.   No facility-administered encounter medications on file as of 02/23/2017.          Objective:   Physical Exam  Constitutional: She is oriented to person, place, and time. She appears well-developed and well-nourished.  HENT:  Head: Normocephalic and atraumatic.  Eyes: Conjunctivae and EOM  are normal.  Cardiovascular: Normal rate.   Pulmonary/Chest: Effort normal.  Neurological: She is alert and oriented to person, place, and time.  Skin: Skin is dry. No pallor.  Psychiatric: She has a normal mood and affect. Her behavior is normal.  Vitals reviewed.         Assessment & Plan:  Mood disorder unspecified. Previously have treated her for depression and anxiety and social anxiety. 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 I strongly supported her in completing and outpatient intensive program. She is very fearful of taking medication as she has had previous reaction to Abilify and she is nervous about trying anything new. She  would be an 8 well monitored environment and would able to be able to start some group therapy which I think would be helpful for her. She mostly works on the weekends Friday Saturdays and Sundays and then is off during the week. I also encouraged her to apply for FMLA. At this point she's missed multiple days at work and I think honestly until she is able to get some medical help that she should probably continue to be out of work until she is able to give her full in her best. I'm worried if she continues to try to go in her current state that she will not perform to the top of her potential level that this max a cost her her job from having to leave early or not being up to actually come to work some days. I did give her a work note for last weekend which she missed as she did seek medical care and to the 24th which is 1 her first day with the outpatient therapy program starts. It does sound like she has a good supportive network of friends and this is helpful. We also called [facility] for more information about the group therapy to get a little bit better understanding. Evidently it will be led by 2 people one a female and one a female. She says she does feel a little bit more comfortable with that.  We also had a long conversation about the fact that mood disorders are very difficult to live with and can be very challenging especially with ups and downs in mood. I explained that it's often like other chronic medical conditions. He requires daily maintenance and care and regular appointments to keep things in check and to help keep her mood overall stable so that she doesn't have periods of feeling extremely depressed even years of feeling depressed and then having times where she feels more like herself. And often it takes medications to help keep these moods etc. and check just like chronic conditions can require medications to help improve long-term results and reduce risk for complications.  Time spent 45  minutes, greater than 50% of time counseling about mood disorder and treatment.

## 2017-03-20 ENCOUNTER — Ambulatory Visit: Payer: 59 | Admitting: Family Medicine

## 2017-03-27 ENCOUNTER — Ambulatory Visit: Payer: 59 | Admitting: Family Medicine

## 2017-03-27 DIAGNOSIS — Z0189 Encounter for other specified special examinations: Secondary | ICD-10-CM

## 2017-04-10 ENCOUNTER — Ambulatory Visit (INDEPENDENT_AMBULATORY_CARE_PROVIDER_SITE_OTHER): Payer: 59 | Admitting: Family Medicine

## 2017-04-10 ENCOUNTER — Encounter: Payer: Self-pay | Admitting: Family Medicine

## 2017-04-10 VITALS — BP 95/53 | HR 83 | Wt 156.0 lb

## 2017-04-10 DIAGNOSIS — R42 Dizziness and giddiness: Secondary | ICD-10-CM

## 2017-04-10 DIAGNOSIS — F39 Unspecified mood [affective] disorder: Secondary | ICD-10-CM | POA: Diagnosis not present

## 2017-04-10 DIAGNOSIS — Z598 Other problems related to housing and economic circumstances: Secondary | ICD-10-CM | POA: Diagnosis not present

## 2017-04-10 DIAGNOSIS — F410 Panic disorder [episodic paroxysmal anxiety] without agoraphobia: Secondary | ICD-10-CM

## 2017-04-10 DIAGNOSIS — Z599 Problem related to housing and economic circumstances, unspecified: Secondary | ICD-10-CM

## 2017-04-10 NOTE — Progress Notes (Signed)
Subjective:    Patient ID: Kristina Black, female    DOB: 10/03/1990, 26 y.o.   MRN: [id number]  HPI Here for F/U Bipolar D/O.  I last saw her about 6 weeks ago. At that time her anxiety levels were increasing significantly to the point where it was making it difficult for her at work even though she feels like she has a very positive and supportive work environment.she was having thoughts of wanting to harm herself and had actually just seen behavioral health the day before I saw her. They recommended putting her into an outpatient intensive program. She has been going 2 her sessions on Tuesdays, Wednesdays, and Thursdays. They've been goalsetting which has been helpful for her. She was recently started on Depakote and then given a prescription for Ativan to use as needed. She tried taking a couple times over the weekend but says it just made her feel bad and Did not help her relax. She says initially when she started the lower dose of Depakote she did notice an improvement but over the last couple of weeks she actually feels like she's a most more depressed after they bumped her dose up. She just had a therapeutic level checked and was told that they will likely increase her dose again so she is very hesitant to do that and she actually feels a little worse.  Though, she's also been under a lot of stress recently. She will lose her insurance at the end of the month. She is now 56 and her father just lost his job a 33 years. She's trying to figure out how she will continue to get health insurance and she is actively enrolled in an outpatient therapy program.  She is also questioning her diagnosis somewhat of bipolar. She said at one point that she was told that she had PTSD and so she is wondering if the bipolar is the correct diagnosis. But she is open to it.  She is also just been feeling lightheaded dizzy and with frequent headaches over the last several weeks but admits that she doesn't eat  regularly. Sometimes she'll go almost all day before she finally eats. She said the other day as a matter fact she wasn't feeling well and so opted to skip lunch. The nurse there encouraged her to actually eat a little better and then she actually felt better. She has tried checking her blood pressure at home and has had systolic blood pressures in the 80s and 90s. She does feel like she doesn't okay job getting in daily water intake.   Review of Systems     Objective:   Physical Exam  Constitutional: She is oriented to person, place, and time. She appears well-developed and well-nourished.  HENT:  Head: Normocephalic and atraumatic.  Cardiovascular: Normal rate, regular rhythm and normal heart sounds.   Pulmonary/Chest: Effort normal and breath sounds normal.  Neurological: She is alert and oriented to person, place, and time.  Skin: Skin is warm and dry.  Psychiatric: She has a normal mood and affect. Her behavior is normal.          Assessment & Plan:  Bipolar D/O  - Current continue current regimen with Depakote. Certainly I think she should try eating more regularly to see if this may actually help with some of the symptoms such as headaches and dizziness that she's been experiencing. I do not necessarily feel that these are coming from the Depakote. She could certainly talk with her psychiatrist about  holding off on increasing her dose until she at least has a week to work on eating consistently.  Lightheadedness dizziness-I definitely think her lack of eating regularly could be contributing. We discussed starting with breakfast. Even something like a protein drink would be a great start the day and see if this actually helps and how she physically feels each day.  Panic D/O  - currently using Ativan as needed though when she took over the weekend she says it actually made her feel worse. I would encourage her to hold off on taking that medication until she has time to follow-up with  her psychiatrist to discuss it.  Financial stressors-did encourage her to check into Medicaid, as well as patient assistance programs here: As well as [facility], and at [facility] where she is currently getting behavioral health treatment.  I have not received her FMLA paperwork, so I encouraged her to contact her HR or her employer to have this forwarded to that we can make sure that her time was covered while she was out on leave.  Time spent 30 minutes, greater than 50% time spent counseling about mood disorder lightheadedness and panic disorder

## 2017-04-13 ENCOUNTER — Other Ambulatory Visit: Payer: Self-pay | Admitting: *Deleted

## 2017-04-13 DIAGNOSIS — E039 Hypothyroidism, unspecified: Secondary | ICD-10-CM

## 2017-04-13 MED ORDER — LEVOTHYROXINE SODIUM 100 MCG PO TABS: 100.0000 ug | ORAL_TABLET | Freq: Every day | ORAL | 1 refills | Status: DC

## 2017-05-22 ENCOUNTER — Ambulatory Visit: Payer: 59 | Admitting: Family Medicine

## 2017-05-29 ENCOUNTER — Other Ambulatory Visit: Payer: Self-pay | Admitting: *Deleted

## 2017-05-29 DIAGNOSIS — E039 Hypothyroidism, unspecified: Secondary | ICD-10-CM

## 2017-05-29 MED ORDER — LEVOTHYROXINE SODIUM 100 MCG PO TABS: 100.0000 ug | ORAL_TABLET | Freq: Every day | ORAL | 2 refills | Status: DC

## 2017-05-29 NOTE — Progress Notes (Signed)
Pt called and stated that she has lost her health insurance and is currently trying to get benefits. She is asking for a [facility] She also provided an updated phone # [phone] pt informed that rx has been sent.Loralee Pacas [place]

## 2018-02-05 ENCOUNTER — Other Ambulatory Visit: Payer: Self-pay

## 2018-02-05 DIAGNOSIS — E039 Hypothyroidism, unspecified: Secondary | ICD-10-CM

## 2018-02-05 MED ORDER — LEVOTHYROXINE SODIUM 100 MCG PO TABS: 100.0000 ug | ORAL_TABLET | Freq: Every day | ORAL | 0 refills | Status: DC

## 2018-02-05 NOTE — Addendum Note (Signed)
Addended by: Jed LimerickHODGES, Nayson Traweek on: 02/05/2018 02:02 PM   Modules accepted: Orders

## 2018-02-05 NOTE — Telephone Encounter (Signed)
Upcoming appt 03-07-18

## 2018-02-05 NOTE — Telephone Encounter (Signed)
Pt only has 15 tablets left and needs some to hold her over until

## 2018-03-07 ENCOUNTER — Ambulatory Visit (INDEPENDENT_AMBULATORY_CARE_PROVIDER_SITE_OTHER): Payer: Self-pay | Admitting: Family Medicine

## 2018-03-07 ENCOUNTER — Encounter: Payer: Self-pay | Admitting: Family Medicine

## 2018-03-07 VITALS — BP 94/55 | HR 76 | Ht 67.0 in | Wt 159.0 lb

## 2018-03-07 DIAGNOSIS — R5383 Other fatigue: Secondary | ICD-10-CM

## 2018-03-07 DIAGNOSIS — E039 Hypothyroidism, unspecified: Secondary | ICD-10-CM

## 2018-03-07 DIAGNOSIS — Z0000 Encounter for general adult medical examination without abnormal findings: Secondary | ICD-10-CM

## 2018-03-07 DIAGNOSIS — Z113 Encounter for screening for infections with a predominantly sexual mode of transmission: Secondary | ICD-10-CM

## 2018-03-07 DIAGNOSIS — R7989 Other specified abnormal findings of blood chemistry: Secondary | ICD-10-CM

## 2018-03-07 NOTE — Progress Notes (Signed)
Subjective:     Kristina Black is a 27 y.o. female and is here for a comprehensive physical exam. The patient reports no problems.  She did want to let me know that she had a rash in the groin area back in January and went to a medical facility in [place] and says was treated with just hydrocortisone cream.  She says she just feels extremely tired and fatigued.  Is not sure if part of it is her mood and not being able to get good control over her mood over this last year or if it is more physical.  Social History   Socioeconomic History  . Marital status: Single    Spouse name: Not on file  . Number of children: Not on file  . Years of education: Not on file  . Highest education level: Not on file  Occupational History  . Occupation: unemployed   Social Needs  . Financial resource strain: Not on file  . Food insecurity:    Worry: Not on file    Inability: Not on file  . Transportation needs:    Medical: Not on file    Non-medical: Not on file  Tobacco Use  . Smoking status: Never Smoker  . Smokeless tobacco: Never Used  Substance and Sexual Activity  . Alcohol use: No  . Drug use: No  . Sexual activity: Yes  Lifestyle  . Physical activity:    Days per week: Not on file    Minutes per session: Not on file  . Stress: Not on file  Relationships  . Social connections:    Talks on phone: Not on file    Gets together: Not on file    Attends religious service: Not on file    Active member of club or organization: Not on file    Attends meetings of clubs or organizations: Not on file    Relationship status: Not on file  . Intimate partner violence:    Fear of current or ex partner: Not on file    Emotionally abused: Not on file    Physically abused: Not on file    Forced sexual activity: Not on file  Other Topics Concern  . Not on file  Social History Narrative   Currently out of work after college. She does exercise.    Health Maintenance  Topic Date Due  .  INFLUENZA VACCINE  03/15/2018  . TETANUS/TDAP  04/07/2018  . PAP SMEAR  01/18/2020  . HIV Screening  Completed    The following portions of the patient's history were reviewed and updated as appropriate: allergies, current medications, past family history, past medical history, past social history, past surgical history and problem list.  Review of Systems A comprehensive review of systems was negative.   Objective:    BP (!) 94/55   Pulse 76   Ht 5\' 7"  (1.702 m)   Wt 159 lb (72.1 kg)   SpO2 99%   BMI 24.90 kg/m  General appearance: alert, cooperative and appears stated age Head: Normocephalic, without obvious abnormality, atraumatic Eyes: conj clear, EOMI, PEERLA Ears: normal TM's and external ear canals both ears Nose: Nares normal. Septum midline. Mucosa normal. No drainage or sinus tenderness. Throat: lips, mucosa, and tongue normal; teeth and gums normal Neck: no adenopathy, no carotid bruit, no JVD, supple, symmetrical, trachea midline and thyroid not enlarged, symmetric, no tenderness/mass/nodules Back: symmetric, no curvature. ROM normal. No CVA tenderness. Lungs: clear to auscultation bilaterally Breasts: normal appearance, no masses  or tenderness Heart: regular rate and rhythm, S1, S2 normal, no murmur, click, rub or gallop Abdomen: soft, non-tender; bowel sounds normal; no masses,  no organomegaly Pelvic: cervix normal in appearance, external genitalia normal, no adnexal masses or tenderness, no cervical motion tenderness, rectovaginal septum normal, uterus normal size, shape, and consistency and vagina normal without discharge Extremities: extremities normal, atraumatic, no cyanosis or edema Pulses: 2+ and symmetric Skin: Skin color, texture, turgor normal. No rashes or lesions Lymph nodes: Cervical, supraclavicular, and axillary nodes normal. Neurologic: Alert and oriented X 3, normal strength and tone. Normal symmetric reflexes. Normal coordination and gait     Assessment:    Healthy female exam.     Plan:     See After Visit Summary for Counseling Recommendations   Keep up a regular exercise program and make sure you are eating a healthy diet Try to eat 4 servings of dairy a day, or if you are lactose intolerant take a calcium with vitamin D daily.  Your vaccines are up to date.  Urine GC chlamydia and wet prep performed.  Pap smear up-to-date.  Will call with results once available.  hypothyroidism-due to recheck levels.  Fatigue-we will check additional labs just to rule out thyroid disorder and deficiencies.  Low vitamin D-due to recheck levels.  She does see psychiatry and unfortunately they have tried multiple medications over the last last year and she is had a lot of side effects.  They are actually considering doing genetic testing to see what she might be a good candidate for.  Her anxiety levels have been so high that she is had a hard time even just volunteering.  She ended up quitting her job.  She does have a supportive family.

## 2018-03-07 NOTE — Patient Instructions (Signed)
Preventive Care 18-39 Years, Female Preventive care refers to lifestyle choices and visits with your health care provider that can promote health and wellness. What does preventive care include?  A yearly physical exam. This is also called an annual well check.  Dental exams once or twice a year.  Routine eye exams. Ask your health care provider how often you should have your eyes checked.  Personal lifestyle choices, including: ? Daily care of your teeth and gums. ? Regular physical activity. ? Eating a healthy diet. ? Avoiding tobacco and drug use. ? Limiting alcohol use. ? Practicing safe sex. ? Taking vitamin and mineral supplements as recommended by your health care provider. What happens during an annual well check? The services and screenings done by your health care provider during your annual well check will depend on your age, overall health, lifestyle risk factors, and family history of disease. Counseling Your health care provider may ask you questions about your:  Alcohol use.  Tobacco use.  Drug use.  Emotional well-being.  Home and relationship well-being.  Sexual activity.  Eating habits.  Work and work Statistician.  Method of birth control.  Menstrual cycle.  Pregnancy history.  Screening You may have the following tests or measurements:  Height, weight, and BMI.  Diabetes screening. This is done by checking your blood sugar (glucose) after you have not eaten for a while (fasting).  Blood pressure.  Lipid and cholesterol levels. These may be checked every 5 years starting at age 38.  Skin check.  Hepatitis C blood test.  Hepatitis B blood test.  Sexually transmitted disease (STD) testing.  BRCA-related cancer screening. This may be done if you have a family history of breast, ovarian, tubal, or peritoneal cancers.  Pelvic exam and Pap test. This may be done every 3 years starting at age 38. Starting at age 30, this may be done  every 5 years if you have a Pap test in combination with an HPV test.  Discuss your test results, treatment options, and if necessary, the need for more tests with your health care provider. Vaccines Your health care provider may recommend certain vaccines, such as:  Influenza vaccine. This is recommended every year.  Tetanus, diphtheria, and acellular pertussis (Tdap, Td) vaccine. You may need a Td booster every 10 years.  Varicella vaccine. You may need this if you have not been vaccinated.  HPV vaccine. If you are 39 or younger, you may need three doses over 6 months.  Measles, mumps, and rubella (MMR) vaccine. You may need at least one dose of MMR. You may also need a second dose.  Pneumococcal 13-valent conjugate (PCV13) vaccine. You may need this if you have certain conditions and were not previously vaccinated.  Pneumococcal polysaccharide (PPSV23) vaccine. You may need one or two doses if you smoke cigarettes or if you have certain conditions.  Meningococcal vaccine. One dose is recommended if you are age 68-21 years and a first-year college student living in a residence hall, or if you have one of several medical conditions. You may also need additional booster doses.  Hepatitis A vaccine. You may need this if you have certain conditions or if you travel or work in places where you may be exposed to hepatitis A.  Hepatitis B vaccine. You may need this if you have certain conditions or if you travel or work in places where you may be exposed to hepatitis B.  Haemophilus influenzae type b (Hib) vaccine. You may need this  if you have certain risk factors.  Talk to your health care provider about which screenings and vaccines you need and how often you need them. This information is not intended to replace advice given to you by your health care provider. Make sure you discuss any questions you have with your health care provider. Document Released: 09/27/2001 Document Revised:  04/20/2016 Document Reviewed: 06/02/2015 Elsevier Interactive Patient Education  2018 Elsevier Inc.  

## 2018-03-08 LAB — SURESWAB CT/NG/T. VAGINALIS
C. TRACHOMATIS RNA, TMA: NOT DETECTED
N. GONORRHOEAE RNA, TMA: NOT DETECTED
Trichomonas vaginalis RNA: NOT DETECTED

## 2018-03-08 LAB — WET PREP FOR TRICH, YEAST, CLUE
MICRO NUMBER:: 90876470
Specimen Quality: ADEQUATE

## 2018-03-08 MED ORDER — METRONIDAZOLE 0.75 % VA GEL: 1.0000 | Freq: Two times a day (BID) | VAGINAL | 0 refills | Status: AC

## 2018-03-08 NOTE — Addendum Note (Signed)
Addended by: Nani GasserMETHENEY, Steward Sames D on: 03/08/2018 06:00 PM   Modules accepted: Orders

## 2018-03-13 LAB — COMPLETE METABOLIC PANEL WITH GFR
AG RATIO: 2 (calc) (ref 1.0–2.5)
ALBUMIN MSPROF: 4.4 g/dL (ref 3.6–5.1)
ALT: 7 U/L (ref 6–29)
AST: 12 U/L (ref 10–30)
Alkaline phosphatase (APISO): 60 U/L (ref 33–115)
BILIRUBIN TOTAL: 0.6 mg/dL (ref 0.2–1.2)
BUN: 9 mg/dL (ref 7–25)
CALCIUM: 9.4 mg/dL (ref 8.6–10.2)
CO2: 27 mmol/L (ref 20–32)
Chloride: 105 mmol/L (ref 98–110)
Creat: 0.83 mg/dL (ref 0.50–1.10)
GFR, EST AFRICAN AMERICAN: 113 mL/min/{1.73_m2} (ref >=60)
GFR, Est Non African American: 97 mL/min/{1.73_m2} (ref >=60)
GLOBULIN: 2.2 g/dL (ref 1.9–3.7)
Glucose, Bld: 96 mg/dL (ref 65–99)
POTASSIUM: 3.9 mmol/L (ref 3.5–5.3)
SODIUM: 139 mmol/L (ref 135–146)
TOTAL PROTEIN: 6.6 g/dL (ref 6.1–8.1)

## 2018-03-13 LAB — CBC
HEMATOCRIT: 38.1 % (ref 35.0–45.0)
Hemoglobin: 12.9 g/dL (ref 11.7–15.5)
MCH: 29 pg (ref 27.0–33.0)
MCHC: 33.9 g/dL (ref 32.0–36.0)
MCV: 85.6 fL (ref 80.0–100.0)
MPV: 12.5 fL (ref 7.5–12.5)
PLATELETS: 187 10*3/uL (ref 140–400)
RBC: 4.45 10*6/uL (ref 3.80–5.10)
RDW: 13.1 % (ref 11.0–15.0)
WBC: 7.5 10*3/uL (ref 3.8–10.8)

## 2018-03-13 LAB — LIPID PANEL
CHOL/HDL RATIO: 2.9 (calc) (ref <5.0)
Cholesterol: 143 mg/dL (ref <200)
HDL: 50 mg/dL — ABNORMAL LOW (ref >50)
LDL CHOLESTEROL (CALC): 74 mg/dL
NON-HDL CHOLESTEROL (CALC): 93 mg/dL (ref <130)
TRIGLYCERIDES: 107 mg/dL (ref <150)

## 2018-03-13 LAB — FERRITIN: Ferritin: 23 ng/mL (ref 16–154)

## 2018-03-13 LAB — B12 AND FOLATE PANEL
Folate: 11.3 ng/mL
VITAMIN B 12: 319 pg/mL (ref 200–1100)

## 2018-03-13 LAB — TSH: TSH: 0.52 m[IU]/L

## 2018-03-13 LAB — VITAMIN D 25 HYDROXY (VIT D DEFICIENCY, FRACTURES): Vit D, 25-Hydroxy: 22 ng/mL — ABNORMAL LOW (ref 30–100)

## 2018-03-14 ENCOUNTER — Other Ambulatory Visit: Payer: Self-pay

## 2018-03-19 ENCOUNTER — Telehealth: Payer: Self-pay | Admitting: Family Medicine

## 2018-03-19 DIAGNOSIS — E039 Hypothyroidism, unspecified: Secondary | ICD-10-CM

## 2018-03-19 MED ORDER — LEVOTHYROXINE SODIUM 100 MCG PO TABS: 100.0000 ug | ORAL_TABLET | Freq: Every day | ORAL | 1 refills | Status: DC

## 2018-03-19 NOTE — Telephone Encounter (Signed)
Pt advised of her lab results. Refill sent to pharmacy.

## 2018-03-19 NOTE — Telephone Encounter (Signed)
Patient came in to ask about the results of her recent labs and to inquire about a refill for her thyroid medication. She is completely out of her medication and [facility] [address]. Please advise. Thanks!

## 2018-05-28 ENCOUNTER — Encounter: Payer: Self-pay | Admitting: Emergency Medicine

## 2018-05-28 DIAGNOSIS — F39 Unspecified mood [affective] disorder: Secondary | ICD-10-CM

## 2018-05-28 DIAGNOSIS — F419 Anxiety disorder, unspecified: Secondary | ICD-10-CM

## 2018-06-04 IMAGING — DX DG FOOT COMPLETE 3+V*R*
3 series · 3 of 3 positions shown · non-contrast
Comparison: None.

CLINICAL DATA: Right foot injury yesterday with pain, initial
encounter

EXAM:
RIGHT FOOT COMPLETE - 3+ VIEW

[foot ap]
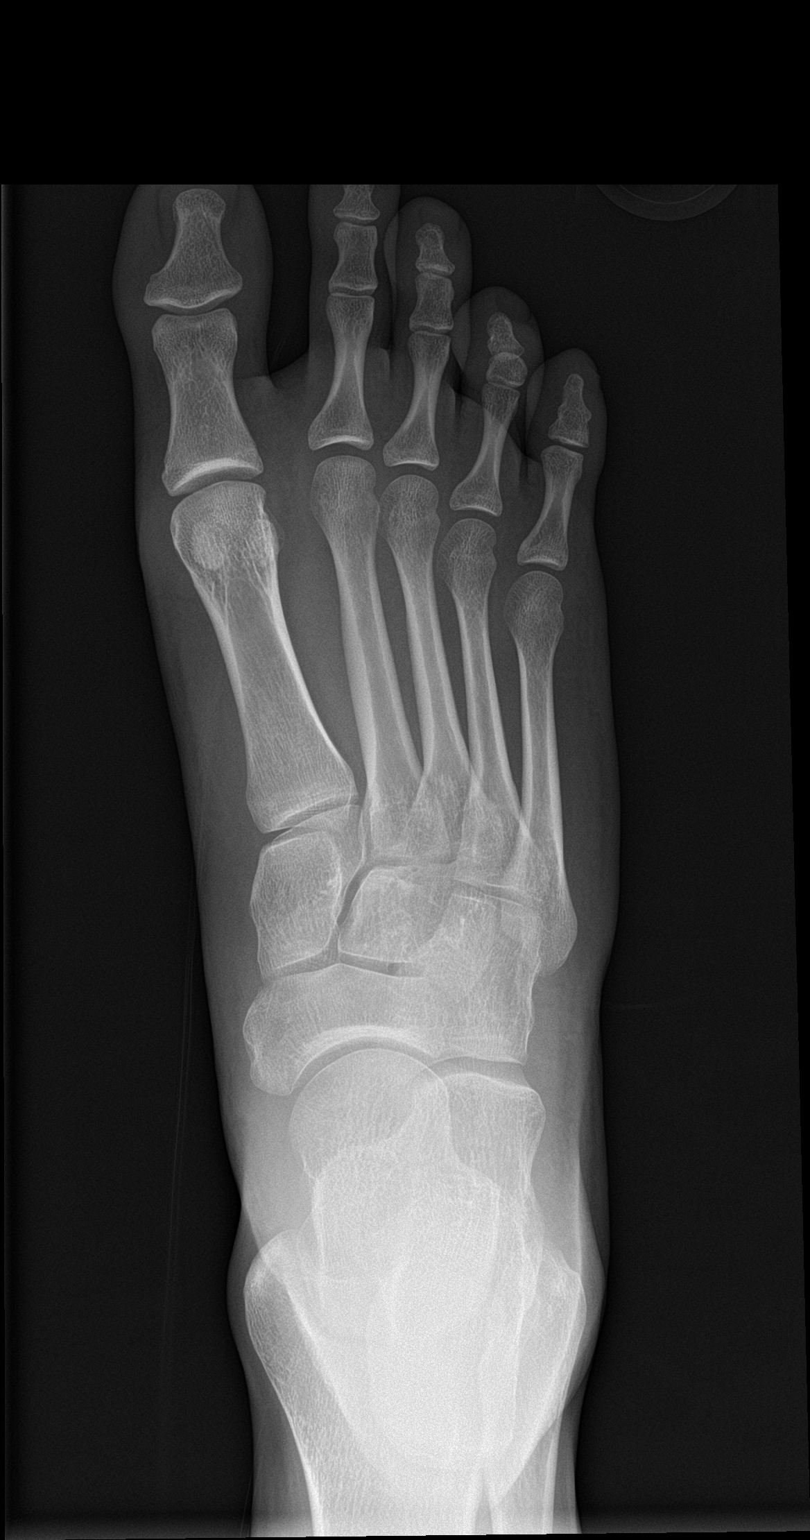

[foot obl]
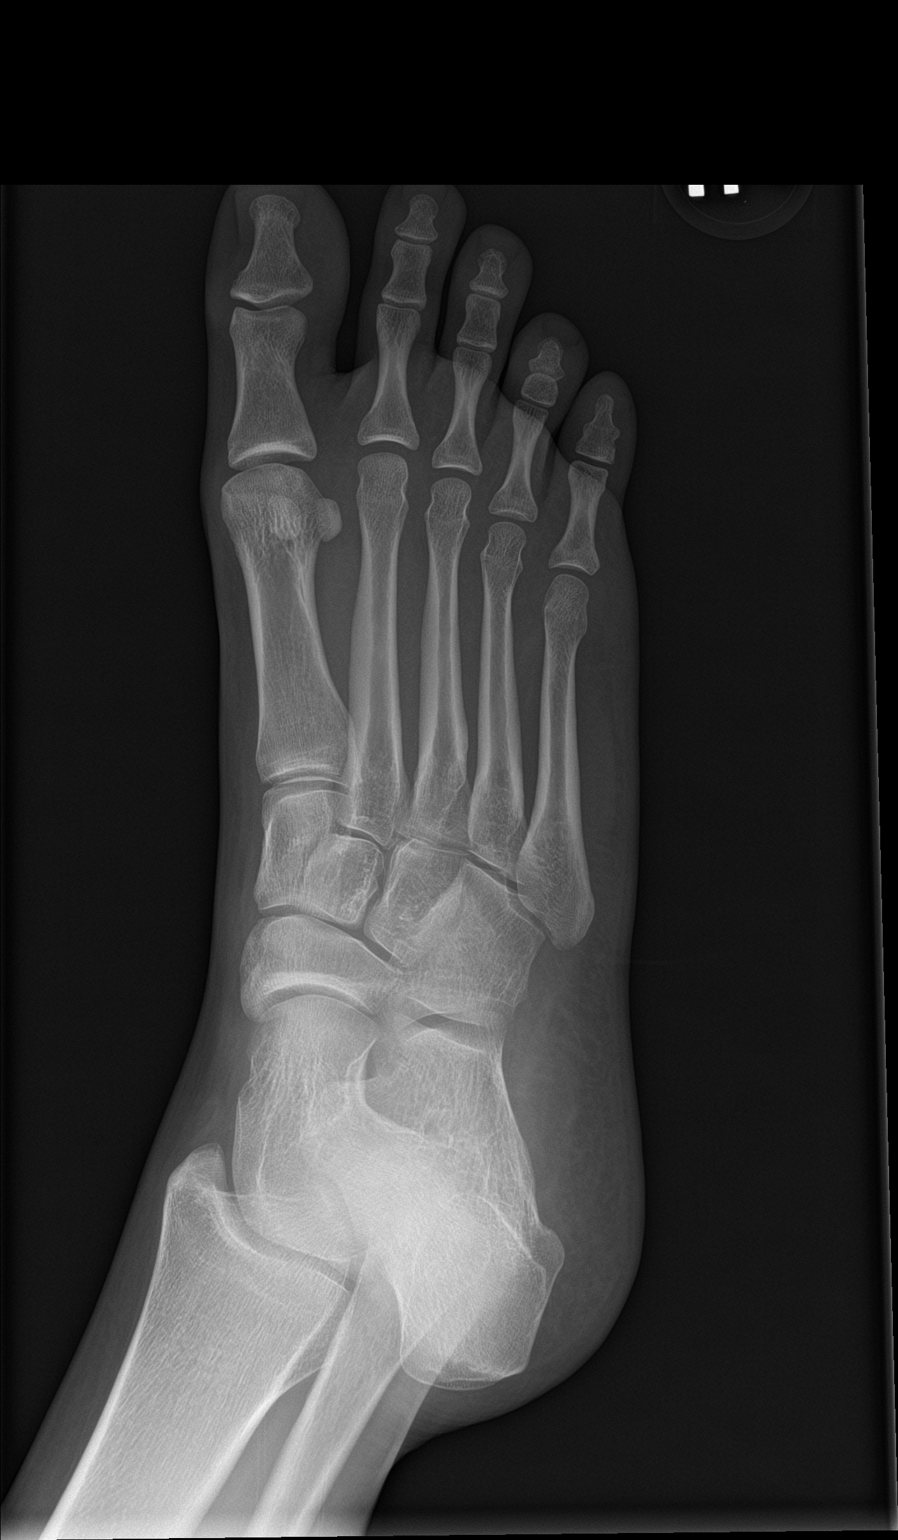

[foot lat]
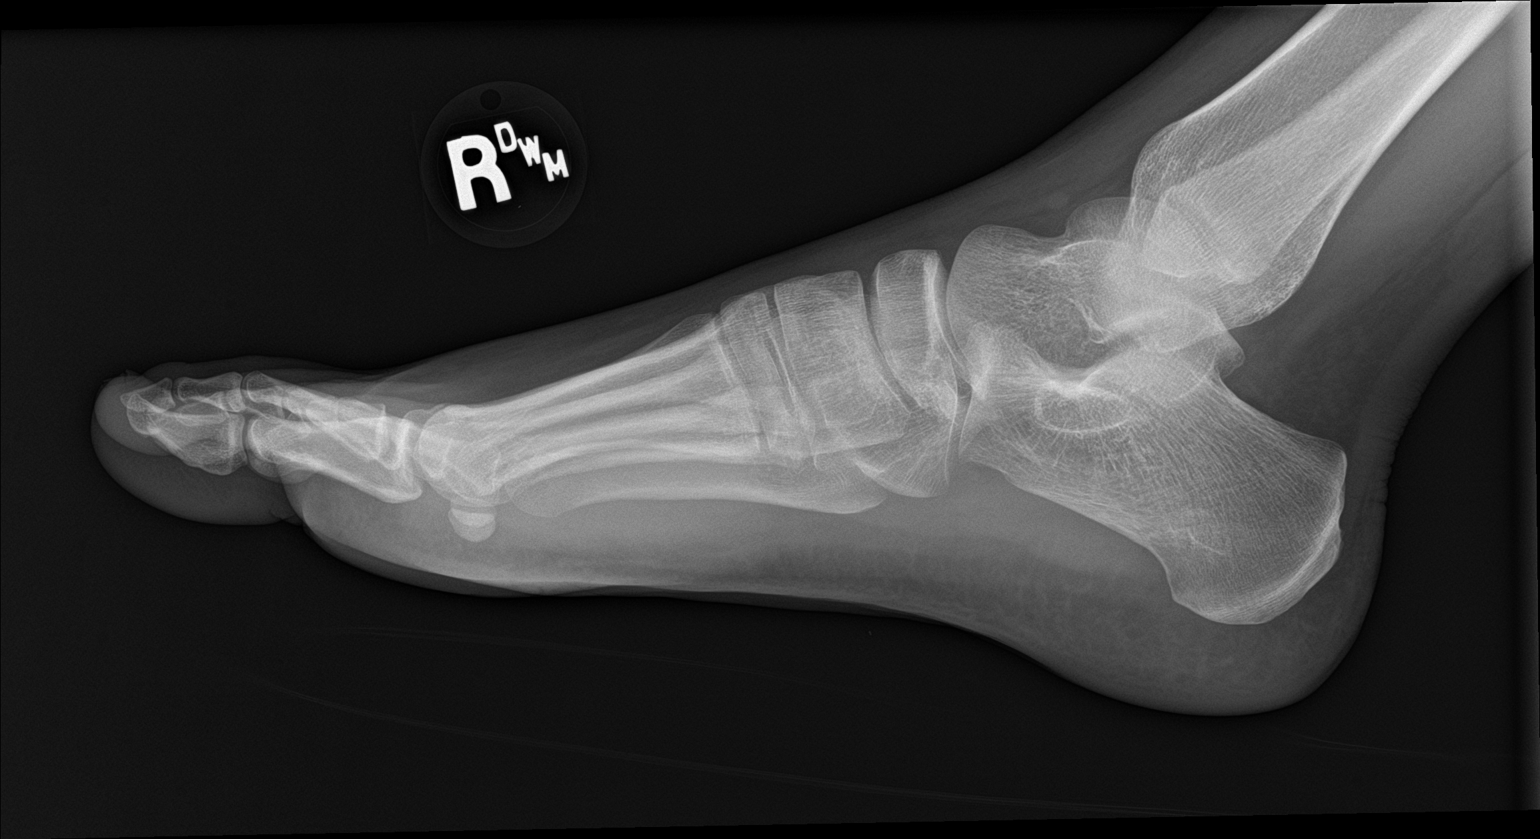

[3 of 3 positions shown; findings below may reference images not displayed]

FINDINGS: There is no evidence of fracture or dislocation. There is no
evidence of arthropathy or other focal bone abnormality. Soft
tissues are unremarkable.
IMPRESSION: No acute abnormality noted.

## 2018-06-06 ENCOUNTER — Ambulatory Visit: Payer: Self-pay | Admitting: Psychiatry

## 2018-06-18 ENCOUNTER — Ambulatory Visit (INDEPENDENT_AMBULATORY_CARE_PROVIDER_SITE_OTHER): Payer: Self-pay | Admitting: Family Medicine

## 2018-06-18 ENCOUNTER — Encounter: Payer: Self-pay | Admitting: Family Medicine

## 2018-06-18 VITALS — BP 88/66 | HR 96 | Ht 67.0 in | Wt 154.0 lb

## 2018-06-18 DIAGNOSIS — R7309 Other abnormal glucose: Secondary | ICD-10-CM

## 2018-06-18 DIAGNOSIS — R251 Tremor, unspecified: Secondary | ICD-10-CM

## 2018-06-18 DIAGNOSIS — F3162 Bipolar disorder, current episode mixed, moderate: Secondary | ICD-10-CM

## 2018-06-18 DIAGNOSIS — R220 Localized swelling, mass and lump, head: Secondary | ICD-10-CM

## 2018-06-18 DIAGNOSIS — F419 Anxiety disorder, unspecified: Secondary | ICD-10-CM

## 2018-06-18 LAB — POCT GLYCOSYLATED HEMOGLOBIN (HGB A1C): Hemoglobin A1C: 5.1 % (ref 4.0–5.6)

## 2018-06-18 NOTE — Progress Notes (Signed)
Subjective:    Patient ID: Kristina Black, female    DOB: August 27, 1990, 27 y.o.   MRN: [id number]  HPI   27 year old female with a history of bipolar comes in today for several different symptoms.  Last Thursday she went to a [facility] and mom says that when she came home she just did not seem like herself.  By the next day she was noticing that her cheeks along her jawline bilaterally were swollen.  She was processing information normally.  She seemed more forgetful.  Mom says she has checked her and she has not had a fever or chills.  She feels extremely nervous and anxious.  She is had 2 incontinence episodes so far.  She is now feeling shaking.  Feels like her head is burning on the left side.  Says that she is actually seems a little better now when that she is able to remember things.  She said she felt like there was "fever" in the right side of her head but then pain was radiating over her entire head.  She had not taken her thyroid pill over the last couple days.  She has been off of all of her psychiatric medications for about 3 months.   Review of Systems      BP (!) 88/66   Pulse 96   Ht 5\' 7"  (1.702 m)   Wt 154 lb (69.9 kg)   SpO2 99%   BMI 24.12 kg/m     Allergies  Allergen Reactions  . Lamotrigine Rash  . Mirtazapine     Other reaction(s): Other Suicidal ideation, racing thoughts  . Paliperidone Er     Other reaction(s): Dizziness, Hypotension  . Aripiprazole Other (See Comments) and Rash    Muscle cramping, diarrhea, blurry vision,disorientation,heart racing,loss of dexterity in fingers/hands Muscle cramping, diarrhea, blurry vision,disorientation,heart racing,loss of dexterity in fingers/hands  . Penicillins     As a baby. She is unaware of the reaction.     Past Medical History:  Diagnosis Date  . Allergy   . Anxiety   . GOA (generalized osteoarthritis)     No past surgical history on file.  Social History   Socioeconomic History  .  Marital status: Single    Spouse name: Not on file  . Number of children: Not on file  . Years of education: Not on file  . Highest education level: Not on file  Occupational History  . Occupation: unemployed   Social Needs  . Financial resource strain: Not on file  . Food insecurity:    Worry: Not on file    Inability: Not on file  . Transportation needs:    Medical: Not on file    Non-medical: Not on file  Tobacco Use  . Smoking status: Never Smoker  . Smokeless tobacco: Never Used  Substance and Sexual Activity  . Alcohol use: No  . Drug use: No  . Sexual activity: Yes  Lifestyle  . Physical activity:    Days per week: Not on file    Minutes per session: Not on file  . Stress: Not on file  Relationships  . Social connections:    Talks on phone: Not on file    Gets together: Not on file    Attends religious service: Not on file    Active member of club or organization: Not on file    Attends meetings of clubs or organizations: Not on file    Relationship status: Not on file  .  Intimate partner violence:    Fear of current or ex partner: Not on file    Emotionally abused: Not on file    Physically abused: Not on file    Forced sexual activity: Not on file  Other Topics Concern  . Not on file  Social History Narrative   Currently out of work after college. She does exercise.     Family History  Problem Relation Age of Onset  . Hypothyroidism Father   . Diabetes type II Father   . Diabetes type II Paternal Grandmother   . Hypothyroidism Maternal Grandmother     Outpatient Encounter Medications as of 06/18/2018  Medication Sig  . levothyroxine (SYNTHROID, LEVOTHROID) 100 MCG tablet Take 1 tablet (100 mcg total) by mouth daily.  . [DISCONTINUED] busPIRone (BUSPAR) 30 MG tablet Take 30 mg by mouth daily. 1/2 tablet  . [DISCONTINUED] divalproex (DEPAKOTE) 250 MG DR tablet Take 250 mg by mouth 2 (two) times daily.  . [DISCONTINUED] LORazepam (ATIVAN) 0.5 MG tablet  Take 0.5 mg by mouth as needed for anxiety.  . [DISCONTINUED] lurasidone (LATUDA) 20 MG TABS tablet Take 20 mg by mouth. 1/2 po qd x 1 week, then 20 mg po qd  . [DISCONTINUED] PARoxetine (PAXIL) 10 MG tablet Take 10 mg by mouth daily.   No facility-administered encounter medications on file as of 06/18/2018.       Objective:   Physical Exam  Constitutional: She is oriented to person, place, and time. She appears well-developed and well-nourished.  HENT:  Head: Normocephalic and atraumatic.  Right Ear: External ear normal.  Left Ear: External ear normal.  Nose: Nose normal.  Mouth/Throat: Oropharynx is clear and moist.  TMs and canals are clear.   Eyes: Pupils are equal, round, and reactive to light. Conjunctivae and EOM are normal.  Neck: Neck supple. No thyromegaly present.  Cardiovascular: Normal rate, regular rhythm and normal heart sounds.  Pulmonary/Chest: Effort normal and breath sounds normal. She has no wheezes.  Lymphadenopathy:    She has no cervical adenopathy.  Neurological: She is alert and oriented to person, place, and time.  Skin: Skin is warm and dry.  Psychiatric: She has a normal mood and affect.      Assessment & Plan:  Jaw swelling -unclear etiology.  It seems like most of the swelling has actually gone down.  Mom was able to show me some photos from Friday.  It seems like the Benadryl was actually helping.  She does have some raw areas on the inside of the cheeks bilaterally that are irritated I cannot tell if she is been chewing on the inside her jaw just the swelling has caused some abrasion against her teeth.  No significant cervical lymphadenopathy on exam.  We will check a CBC.  Hypothyroidism-she is actually due for refill on her thyroid medication.  Last TSH level in July was normal but because of changes in mental status today I would like to recheck the TSH.  Will be happy to send over refills.  Bipolar disorder-I feel like she is acutely having a  psychotic break.  He is actually having very grandiose ideas at every encounter that she has with a person should bring him joy and that she wants them to understand that she cares about them.  She is moving and writhing back and forth in the room when she gets more agitated will move more.  She is pulling at her close keeps constantly running her hands through her hair as  she speaks.  Very easily frustrated during the conversation today.  I discussed a behavioral health consult for her.  She said she did not want to do so.  She was worried that they would not understand her.  Her mom Victorino Dike was here with her and was very caring and compassionate during the conversation and I know she will keep an eye out on her and make sure that she is safe.  I really think she would benefit from being back on some type of bipolar medication regimen.    Plan to call and check on her later.   Time spent 40 min, 50% time spent face-to-face counseling about jaw swelling, thyroid is, bipolar disorder.

## 2018-06-19 ENCOUNTER — Other Ambulatory Visit: Payer: Self-pay | Admitting: Family Medicine

## 2018-06-19 ENCOUNTER — Telehealth: Payer: Self-pay

## 2018-06-19 DIAGNOSIS — R4182 Altered mental status, unspecified: Secondary | ICD-10-CM

## 2018-06-19 DIAGNOSIS — R404 Transient alteration of awareness: Secondary | ICD-10-CM

## 2018-06-19 DIAGNOSIS — E039 Hypothyroidism, unspecified: Secondary | ICD-10-CM

## 2018-06-19 LAB — TSH: TSH: 2.78 mIU/L

## 2018-06-19 LAB — CBC WITH DIFFERENTIAL/PLATELET
Basophils Absolute: 17 cells/uL (ref 0–200)
Basophils Relative: 0.2 %
Eosinophils Absolute: 9 cells/uL — ABNORMAL LOW (ref 15–500)
Eosinophils Relative: 0.1 %
HCT: 39 % (ref 35.0–45.0)
Hemoglobin: 13.2 g/dL (ref 11.7–15.5)
LYMPHS ABS: 1634 {cells}/uL (ref 850–3900)
MCH: 29.3 pg (ref 27.0–33.0)
MCHC: 33.8 g/dL (ref 32.0–36.0)
MCV: 86.5 fL (ref 80.0–100.0)
MONOS PCT: 5.1 %
MPV: 12.6 fL — ABNORMAL HIGH (ref 7.5–12.5)
NEUTROS PCT: 75.6 %
Neutro Abs: 6502 cells/uL (ref 1500–7800)
PLATELETS: 237 10*3/uL (ref 140–400)
RBC: 4.51 10*6/uL (ref 3.80–5.10)
RDW: 13.3 % (ref 11.0–15.0)
TOTAL LYMPHOCYTE: 19 %
WBC mixed population: 439 cells/uL (ref 200–950)
WBC: 8.6 10*3/uL (ref 3.8–10.8)

## 2018-06-19 LAB — FERRITIN: FERRITIN: 55 ng/mL (ref 16–154)

## 2018-06-19 LAB — B12 AND FOLATE PANEL
FOLATE: 16.6 ng/mL
Vitamin B-12: 531 pg/mL (ref 200–1100)

## 2018-06-19 MED ORDER — LORAZEPAM 0.5 MG PO TABS: 0.5000 mg | ORAL_TABLET | Freq: Once | ORAL | 0 refills | Status: AC

## 2018-06-19 NOTE — Telephone Encounter (Signed)
[name]'s mom called; she is concerned because Kristina Black had another episode and fell and hit the couch. She states she was not responding. She thinks she had a seizure. Please advise.

## 2018-06-19 NOTE — Telephone Encounter (Signed)
Spoke with [name], she would like to proceed with MRI. She would like something sent to calm her down. Routing.

## 2018-06-19 NOTE — Telephone Encounter (Signed)
I would like to scan her brain with an MRI.  She would have to be able to be still for about 10 minutes though. Would she be ok with this?  I could send in a sedative as well.

## 2018-06-20 NOTE — Telephone Encounter (Signed)
Norberto, "Nor", Ma's dad, called and would like a referral to a new psychiatrist. Preferably a female. He also wants a therapist. I will advise Kirsten for the therapist. He would like information today with the MRI results.     Celso Sickle  Address: [address] [place], [place], [place] [address]  Phone: [phone]

## 2018-06-21 MED ORDER — CLONAZEPAM 0.5 MG PO TABS: 0.5000 mg | ORAL_TABLET | Freq: Two times a day (BID) | ORAL | 0 refills | Status: DC | PRN

## 2018-06-21 NOTE — Telephone Encounter (Signed)
Please call patient and/or her parents and let them know that the MRI was essentially normal.  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  There was no sign of mass or tumor.  No sign of stroke.  No sign of even evidence of seizure activity within the brain.  No evidence of extra fluid or hydrocephalus which can cause increased pressure on the brain.  I strongly recommend that she be evaluated by a psychiatrist.  I would recommend that she go to [facility] they actually have a great behavioral health program there and be evaluated.  She may likely need admission.

## 2018-06-21 NOTE — Telephone Encounter (Signed)
Patient's dad advised of recommendations. He wanted to know if she could get a refill on the Lorazepam. The medication actually helped her calm down and was able to sleep. Please advise.

## 2018-06-21 NOTE — Telephone Encounter (Signed)
Left message advising dad that the medication has been sent.

## 2018-06-26 NOTE — Telephone Encounter (Signed)
Left message for a return call

## 2018-06-26 NOTE — Telephone Encounter (Signed)
I spoke with Kristina Black, Kristina Black's dad, he reports she is doing about 20% better. He does agree she needs to see a psychiatrist and therapist. He is having a hard time getting Kristina Black and her mom to agree. Her mom is worried due to the fact that the last psychiatrist put her on medication that "almost killed her". Her mom is also worried about the lesion seen on the MRI. He would like to have a visit with Dr Linford ArnoldMetheney and all three of them. He states they need guidance.

## 2018-06-26 NOTE — Telephone Encounter (Signed)
Be happy to visit with them.  I would like to go ahead and move forward with a psychiatry referral though so they can at least go ahead and get an appointment on the books.  It can always be canceled but it is much harder to wait and then get an appointment quickly then to just go ahead and do so.

## 2018-06-27 NOTE — Telephone Encounter (Signed)
Left message for a return call

## 2018-06-27 NOTE — Telephone Encounter (Signed)
Patient has been scheduled

## 2018-06-28 ENCOUNTER — Ambulatory Visit (INDEPENDENT_AMBULATORY_CARE_PROVIDER_SITE_OTHER): Payer: Medicaid Other | Admitting: Family Medicine

## 2018-06-28 ENCOUNTER — Encounter: Payer: Self-pay | Admitting: Family Medicine

## 2018-06-28 VITALS — BP 112/75 | HR 97 | Ht 67.62 in | Wt 151.0 lb

## 2018-06-28 DIAGNOSIS — R4182 Altered mental status, unspecified: Secondary | ICD-10-CM

## 2018-06-28 DIAGNOSIS — F39 Unspecified mood [affective] disorder: Secondary | ICD-10-CM

## 2018-06-28 NOTE — Progress Notes (Signed)
Subjective:    Patient ID: Kristina Black, female    DOB: 06/20/1991, 27 y.o.   MRN: [id number]  HPI 27 year old female with a history of anxiety and depression and unspecified affect of mood disorder comes in today for follow-up.  I saw her approximately 10 days ago and at that point she was having significant dysfunction.  She is had a prior diagnosis of bipolar though the family is not sure if that saccular not.  But she has been very dysfunctional.  She is had problems with her memory and being able to do small tasks throughout the day.  She says she can wake up and start her routine but within hours feels like she really just cannot get her brain to function and work and will often times lay down.  She really cannot watch TV because hearing things on the television she feels like they are about her.  She says she will try to do some deep breathing.  She just feels like she is responsible for others peoples happiness and that her job is to help them.  She feels like she is losing track of time and not remembering certain days.  She feels like at times her brain just races so much that she cannot really organize her thoughts.  He feels like she is ultimately responsible for other peoples happiness.  And she feels like when she puts her faith in God that she feels better and feels ultimately healed.  But she feels like maybe her faith is not quite as strong right now.  Though she has been praying.  Her mom and father are both here.  Dad is very supportive of her seeking mental health.  We recently did an MRI which was essentially normal nothing to explain her symptoms.  Mom seems a little bit more hesitant about seeking behavioral health therapy.  She has been on a couple of different medications in the past which have caused some significant symptoms including lamotrigine, mirtazapine, and Abilify.    She has an appointment on Monday.   Review of Systems   BP 112/75   Pulse 97   Ht 5' 7.62" (1.718  m)   Wt 151 lb (68.5 kg)   SpO2 100%   BMI 23.22 kg/m     Allergies  Allergen Reactions  . Lamotrigine Rash  . Mirtazapine     Other reaction(s): Other Suicidal ideation, racing thoughts  . Paliperidone Er     Other reaction(s): Dizziness, Hypotension  . Aripiprazole Other (See Comments) and Rash    Muscle cramping, diarrhea, blurry vision,disorientation,heart racing,loss of dexterity in fingers/hands Muscle cramping, diarrhea, blurry vision,disorientation,heart racing,loss of dexterity in fingers/hands  . Penicillins     As a baby. She is unaware of the reaction.     Past Medical History:  Diagnosis Date  . Allergy   . Anxiety   . GOA (generalized osteoarthritis)     No past surgical history on file.  Social History   Socioeconomic History  . Marital status: Single    Spouse name: Not on file  . Number of children: Not on file  . Years of education: Not on file  . Highest education level: Not on file  Occupational History  . Occupation: unemployed   Social Needs  . Financial resource strain: Not on file  . Food insecurity:    Worry: Not on file    Inability: Not on file  . Transportation needs:    Medical: Not on file  Non-medical: Not on file  Tobacco Use  . Smoking status: Never Smoker  . Smokeless tobacco: Never Used  Substance and Sexual Activity  . Alcohol use: No  . Drug use: No  . Sexual activity: Yes  Lifestyle  . Physical activity:    Days per week: Not on file    Minutes per session: Not on file  . Stress: Not on file  Relationships  . Social connections:    Talks on phone: Not on file    Gets together: Not on file    Attends religious service: Not on file    Active member of club or organization: Not on file    Attends meetings of clubs or organizations: Not on file    Relationship status: Not on file  . Intimate partner violence:    Fear of current or ex partner: Not on file    Emotionally abused: Not on file    Physically abused:  Not on file    Forced sexual activity: Not on file  Other Topics Concern  . Not on file  Social History Narrative   Currently out of work after college. She does exercise.     Family History  Problem Relation Age of Onset  . Hypothyroidism Father   . Diabetes type II Father   . Diabetes type II Paternal Grandmother   . Hypothyroidism Maternal Grandmother     Outpatient Encounter Medications as of 06/28/2018  Medication Sig  . clonazePAM (KLONOPIN) 0.5 MG tablet Take 1 tablet (0.5 mg total) by mouth 2 (two) times daily as needed for anxiety.  Marland Kitchen levothyroxine (SYNTHROID, LEVOTHROID) 100 MCG tablet TAKE 1 TABLET BY MOUTH EVERY DAY   No facility-administered encounter medications on file as of 06/28/2018.           Objective:   Physical Exam  Constitutional: She is oriented to person, place, and time. She appears well-developed and well-nourished.  HENT:  Head: Normocephalic and atraumatic.  Eyes: Conjunctivae and EOM are normal.  Cardiovascular: Normal rate.  Pulmonary/Chest: Effort normal.  Neurological: She is alert and oriented to person, place, and time.  Skin: Skin is dry. No pallor.  Psychiatric: She has a normal mood and affect. Her behavior is normal.  Vitals reviewed.         Assessment & Plan:  Mood disorder unspecified-I do not exactly know what her diagnosis is been she has been told she has been bipolar in the past.  I just expressed the importance of the family of getting her help.  Right now I do feel like she is having a psychotic break and is completely on an functional at home.  If it were not for her parents assistance she would not be doing well at all.  Both her father and mother are very concerned.  Dad said he is Artie called to make an appointment on Monday to be evaluated.  They were also given the number for a life coach that may be they could start using a little bit later on when she is stabilized.  We also discussed the importance of medication  to help control symptoms even though I know she has had some negative experiences in the past with medication it does not mean it may not be very helpful for her in the long run.  We also discussed that it is not a failure of herself for feeling how she is feeling.  She is very much so putting this back on to herself that if  she had more faith in God she would not feel this way.  Time spent 45 minutes, greater than 50% of the time spent counseling face-to-face about unspecified mood disorder with her and her parents both in the room.

## 2018-11-21 ENCOUNTER — Telehealth: Payer: Self-pay | Admitting: Family Medicine

## 2018-11-21 NOTE — Telephone Encounter (Signed)
Father calling in wanting to speak to someone about patient. States that he has been dealing with this for months, since November of last year. States that it has been derailed, things are really bad and wants help. Mother and daughter did not follow up with the referral for patient. Would like to speak to [facility]. Please contact and advise.

## 2018-11-22 NOTE — Telephone Encounter (Signed)
Thank you angela for your diligence on this. Thank you

## 2018-11-22 NOTE — Telephone Encounter (Signed)
Kristina Black has an appointment on April 14 th with Catalina Antigua at 11am. She will have a telephone appointment. [facility] office in [place] Address: [address], [place], [place] [address] Phone: [phone]  Father advised.

## 2018-11-22 NOTE — Telephone Encounter (Signed)
Kristina Black, Kristina Black dad, called and is "at the end of his rope". He states Kristina Black sits in her room on the floor against the wall for hours, with the blind closed and the room dark. He states she walks around the house talking to herself. He has begged [place] and his wife to call and schedule an appointment with the [facility]. He states he can't make the appointment for her that she has to call to schedule. She has refused to call to schedule the appointment. The whole family is worried that they will get a bad psychiatrist.   Kristina Black wanted to know which psychiatrist Kristina Black would recommend.

## 2019-02-13 ENCOUNTER — Other Ambulatory Visit: Payer: Self-pay | Admitting: Family Medicine

## 2019-02-13 DIAGNOSIS — E039 Hypothyroidism, unspecified: Secondary | ICD-10-CM

## 2019-08-12 ENCOUNTER — Ambulatory Visit: Payer: Self-pay | Admitting: Family Medicine

## 2019-08-12 ENCOUNTER — Other Ambulatory Visit: Payer: Self-pay

## 2019-08-12 VITALS — BP 118/82 | HR 94 | Temp 98.4°F | Wt 154.0 lb

## 2019-08-12 DIAGNOSIS — Z111 Encounter for screening for respiratory tuberculosis: Secondary | ICD-10-CM

## 2019-08-12 NOTE — Progress Notes (Signed)
Pt in office today to have PPD placed. PPD was placed on left forearm. Pt tolerated procedure well, with out complications.Pt instructed to return in 48-72 hours to have test read.

## 2019-08-13 NOTE — Progress Notes (Signed)
Agree with documentation as above.   Teonna Coonan, MD  

## 2019-08-14 ENCOUNTER — Ambulatory Visit: Payer: Medicaid Other

## 2019-08-15 ENCOUNTER — Encounter: Payer: Self-pay | Admitting: Family Medicine

## 2019-08-15 ENCOUNTER — Other Ambulatory Visit: Payer: Self-pay

## 2019-08-15 ENCOUNTER — Ambulatory Visit (INDEPENDENT_AMBULATORY_CARE_PROVIDER_SITE_OTHER): Payer: Self-pay | Admitting: Family Medicine

## 2019-08-15 VITALS — BP 92/54 | HR 84 | Ht 68.0 in | Wt 144.0 lb

## 2019-08-15 DIAGNOSIS — E039 Hypothyroidism, unspecified: Secondary | ICD-10-CM

## 2019-08-15 DIAGNOSIS — Z23 Encounter for immunization: Secondary | ICD-10-CM

## 2019-08-15 DIAGNOSIS — Z0000 Encounter for general adult medical examination without abnormal findings: Secondary | ICD-10-CM

## 2019-08-15 LAB — TB SKIN TEST
Induration: 0 mm
TB Skin Test: NEGATIVE

## 2019-08-15 NOTE — Patient Instructions (Signed)
Health Maintenance, Female Adopting a healthy lifestyle and getting preventive care are important in promoting health and wellness. Ask your health care provider about:  The right schedule for you to have regular tests and exams.  Things you can do on your own to prevent diseases and keep yourself healthy. What should I know about diet, weight, and exercise? Eat a healthy diet   Eat a diet that includes plenty of vegetables, fruits, low-fat dairy products, and lean protein.  Do not eat a lot of foods that are high in solid fats, added sugars, or sodium. Maintain a healthy weight Body mass index (BMI) is used to identify weight problems. It estimates body fat based on height and weight. Your health care provider can help determine your BMI and help you achieve or maintain a healthy weight. Get regular exercise Get regular exercise. This is one of the most important things you can do for your health. Most adults should:  Exercise for at least 150 minutes each week. The exercise should increase your heart rate and make you sweat (moderate-intensity exercise).  Do strengthening exercises at least twice a week. This is in addition to the moderate-intensity exercise.  Spend less time sitting. Even light physical activity can be beneficial. Watch cholesterol and blood lipids Have your blood tested for lipids and cholesterol at 28 years of age, then have this test every 5 years. Have your cholesterol levels checked more often if:  Your lipid or cholesterol levels are high.  You are older than 28 years of age.  You are at high risk for heart disease. What should I know about cancer screening? Depending on your health history and family history, you may need to have cancer screening at various ages. This may include screening for:  Breast cancer.  Cervical cancer.  Colorectal cancer.  Skin cancer.  Lung cancer. What should I know about heart disease, diabetes, and high blood  pressure? Blood pressure and heart disease  High blood pressure causes heart disease and increases the risk of stroke. This is more likely to develop in people who have high blood pressure readings, are of African descent, or are overweight.  Have your blood pressure checked: ? Every 3-5 years if you are 18-39 years of age. ? Every year if you are 40 years old or older. Diabetes Have regular diabetes screenings. This checks your fasting blood sugar level. Have the screening done:  Once every three years after age 40 if you are at a normal weight and have a low risk for diabetes.  More often and at a younger age if you are overweight or have a high risk for diabetes. What should I know about preventing infection? Hepatitis B If you have a higher risk for hepatitis B, you should be screened for this virus. Talk with your health care provider to find out if you are at risk for hepatitis B infection. Hepatitis C Testing is recommended for:  Everyone born from 1945 through 1965.  Anyone with known risk factors for hepatitis C. Sexually transmitted infections (STIs)  Get screened for STIs, including gonorrhea and chlamydia, if: ? You are sexually active and are younger than 28 years of age. ? You are older than 28 years of age and your health care provider tells you that you are at risk for this type of infection. ? Your sexual activity has changed since you were last screened, and you are at increased risk for chlamydia or gonorrhea. Ask your health care provider if   you are at risk.  Ask your health care provider about whether you are at high risk for HIV. Your health care provider may recommend a prescription medicine to help prevent HIV infection. If you choose to take medicine to prevent HIV, you should first get tested for HIV. You should then be tested every 3 months for as long as you are taking the medicine. Pregnancy  If you are about to stop having your period (premenopausal) and  you may become pregnant, seek counseling before you get pregnant.  Take 400 to 800 micrograms (mcg) of folic acid every day if you become pregnant.  Ask for birth control (contraception) if you want to prevent pregnancy. Osteoporosis and menopause Osteoporosis is a disease in which the bones lose minerals and strength with aging. This can result in bone fractures. If you are 65 years old or older, or if you are at risk for osteoporosis and fractures, ask your health care provider if you should:  Be screened for bone loss.  Take a calcium or vitamin D supplement to lower your risk of fractures.  Be given hormone replacement therapy (HRT) to treat symptoms of menopause. Follow these instructions at home: Lifestyle  Do not use any products that contain nicotine or tobacco, such as cigarettes, e-cigarettes, and chewing tobacco. If you need help quitting, ask your health care provider.  Do not use street drugs.  Do not share needles.  Ask your health care provider for help if you need support or information about quitting drugs. Alcohol use  Do not drink alcohol if: ? Your health care provider tells you not to drink. ? You are pregnant, may be pregnant, or are planning to become pregnant.  If you drink alcohol: ? Limit how much you use to 0-1 drink a day. ? Limit intake if you are breastfeeding.  Be aware of how much alcohol is in your drink. In the [place] one drink equals one 12 oz bottle of beer (355 mL), one 5 oz glass of wine (148 mL), or one 1 oz glass of hard liquor (44 mL). General instructions  Schedule regular health, dental, and eye exams.  Stay current with your vaccines.  Tell your health care provider if: ? You often feel depressed. ? You have ever been abused or do not feel safe at home. Summary  Adopting a healthy lifestyle and getting preventive care are important in promoting health and wellness.  Follow your health care provider's instructions about healthy  diet, exercising, and getting tested or screened for diseases.  Follow your health care provider's instructions on monitoring your cholesterol and blood pressure. This information is not intended to replace advice given to you by your health care provider. Make sure you discuss any questions you have with your health care provider. Document Revised: 07/25/2018 Document Reviewed: 07/25/2018 Elsevier Patient Education  2020 Elsevier Inc.  

## 2019-08-15 NOTE — Progress Notes (Signed)
Subjective:     Kristina Black is a 28 y.o. female and is here for a comprehensive physical exam. The patient reports no problems. She reports anxiety levels were high for a time but they actually seem to be better.  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  She does exercise regularly and runs 3 to 4 days a week.  Social History   Socioeconomic History  . Marital status: Single    Spouse name: Not on file  . Number of children: Not on file  . Years of education: Not on file  . Highest education level: Not on file  Occupational History  . Occupation: unemployed   Tobacco Use  . Smoking status: Never Smoker  . Smokeless tobacco: Never Used  Substance and Sexual Activity  . Alcohol use: No  . Drug use: No  . Sexual activity: Yes  Other Topics Concern  . Not on file  Social History Narrative   Currently out of work after college. She does exercise.    Social Determinants of Health   Financial Resource Strain:   . Difficulty of Paying Living Expenses: Not on file  Food Insecurity:   . Worried About Programme researcher, broadcasting/film/video in the Last Year: Not on file  . Ran Out of Food in the Last Year: Not on file  Transportation Needs:   . Lack of Transportation (Medical): Not on file  . Lack of Transportation (Non-Medical): Not on file  Physical Activity:   . Days of Exercise per Week: Not on file  . Minutes of Exercise per Session: Not on file  Stress:   . Feeling of Stress : Not on file  Social Connections:   . Frequency of Communication with Friends and Family: Not on file  . Frequency of Social Gatherings with Friends and Family: Not on file  . Attends Religious Services: Not on file  . Active Member of Clubs or Organizations: Not on file  . Attends Banker Meetings: Not on file  . Marital Status: Not on file  Intimate Partner Violence:   . Fear of Current or Ex-Partner: Not on file  .  Emotionally Abused: Not on file  . Physically Abused: Not on file  . Sexually Abused: Not on file   Health Maintenance  Topic Date Due  . PAP-Cervical Cytology Screening  01/18/2020  . PAP SMEAR-Modifier  01/18/2020  . TETANUS/TDAP  12/25/2026  . INFLUENZA VACCINE  Completed  . HIV Screening  Completed    The following portions of the patient's history were reviewed and updated as appropriate: allergies, current medications, past family history, past medical history, past social history, past surgical history and problem list.  Review of Systems A comprehensive review of systems was negative.   Objective:    BP (!) 92/54   Pulse 84   Ht 5\' 8"  (1.727 m)   Wt 144 lb (65.3 kg)   LMP 08/06/2019 (Exact Date)   SpO2 99%   BMI 21.90 kg/m  General appearance: alert, cooperative and appears stated age Head: Normocephalic, without obvious abnormality, atraumatic Eyes: conj clear. EOMI, PEERLA Ears: normal TM's and external ear canals both ears Nose: Nares normal. Septum midline. Mucosa normal. No drainage or sinus tenderness. Throat: lips, mucosa, and tongue normal; teeth and gums normal Neck: no adenopathy, no carotid bruit, no JVD, supple, symmetrical, trachea midline and thyroid not enlarged, symmetric, no tenderness/mass/nodules Back: symmetric, no  curvature. ROM normal. No CVA tenderness. Lungs: clear to auscultation bilaterally Heart: regular rate and rhythm, S1, S2 normal, no murmur, click, rub or gallop Abdomen: soft, non-tender; bowel sounds normal; no masses,  no organomegaly Extremities: extremities normal, atraumatic, no cyanosis or edema Pulses: 2+ and symmetric Skin: Skin color, texture, turgor normal. No rashes or lesions Lymph nodes: Cervical, supraclavicular, and axillary nodes normal. Neurologic: Alert and oriented X 3, normal strength and tone. Normal symmetric reflexes. Normal coordination and gait    Assessment:    Healthy female exam.      Plan:     See  After Visit Summary for Counseling Recommendations   Keep up a regular exercise program and make sure you are eating a healthy diet Try to eat 4 servings of dairy a day, or if you are lactose intolerant take a calcium with vitamin D daily.  Your vaccines are up to date.  Form completed for work.

## 2019-08-16 LAB — COMPLETE METABOLIC PANEL WITH GFR
AG Ratio: 2.2 (calc) (ref 1.0–2.5)
ALT: 11 U/L (ref 6–29)
AST: 13 U/L (ref 10–30)
Albumin: 4.6 g/dL (ref 3.6–5.1)
Alkaline phosphatase (APISO): 62 U/L (ref 31–125)
BUN: 9 mg/dL (ref 7–25)
CO2: 29 mmol/L (ref 20–32)
Calcium: 9.4 mg/dL (ref 8.6–10.2)
Chloride: 105 mmol/L (ref 98–110)
Creat: 0.87 mg/dL (ref 0.50–1.10)
GFR, Est African American: 105 mL/min/{1.73_m2} (ref >=60)
GFR, Est Non African American: 91 mL/min/{1.73_m2} (ref >=60)
Globulin: 2.1 g/dL (calc) (ref 1.9–3.7)
Glucose, Bld: 86 mg/dL (ref 65–99)
Potassium: 4.3 mmol/L (ref 3.5–5.3)
Sodium: 140 mmol/L (ref 135–146)
Total Bilirubin: 0.6 mg/dL (ref 0.2–1.2)
Total Protein: 6.7 g/dL (ref 6.1–8.1)

## 2019-08-16 LAB — TSH: TSH: 1.6 mIU/L

## 2019-08-18 NOTE — Progress Notes (Signed)
All labs are normal. 

## 2019-10-20 ENCOUNTER — Other Ambulatory Visit: Payer: Self-pay | Admitting: Family Medicine

## 2019-10-20 DIAGNOSIS — E039 Hypothyroidism, unspecified: Secondary | ICD-10-CM

## 2020-01-15 ENCOUNTER — Other Ambulatory Visit: Payer: Self-pay | Admitting: Family Medicine

## 2020-01-15 DIAGNOSIS — E039 Hypothyroidism, unspecified: Secondary | ICD-10-CM

## 2020-02-18 ENCOUNTER — Encounter: Payer: Self-pay | Admitting: Family Medicine

## 2020-02-18 ENCOUNTER — Ambulatory Visit (INDEPENDENT_AMBULATORY_CARE_PROVIDER_SITE_OTHER): Payer: Self-pay | Admitting: Family Medicine

## 2020-02-18 VITALS — BP 118/68 | HR 97 | Ht 68.0 in | Wt 138.0 lb

## 2020-02-18 DIAGNOSIS — E039 Hypothyroidism, unspecified: Secondary | ICD-10-CM

## 2020-02-18 DIAGNOSIS — N898 Other specified noninflammatory disorders of vagina: Secondary | ICD-10-CM

## 2020-02-18 MED ORDER — LEVOTHYROXINE SODIUM 100 MCG PO TABS: 100.0000 ug | ORAL_TABLET | Freq: Every day | ORAL | 2 refills | Status: DC

## 2020-02-18 NOTE — Progress Notes (Signed)
Established Patient Office Visit  Subjective:  Patient ID: Kristina Black, female    DOB: 05/14/1991  Age: 29 y.o. MRN: [id number]  CC:  Chief Complaint  Patient presents with  . Hypothyroidism    HPI Kristina Black presents for   Hypothyroidism - Taking medication regularly in the AM away from food and vitamins, etc. No recent change to skin, hair, or energy levels.  She did let me know that she actually stopped her medication for about a month.  She was not sure she still needed it but said she started to develop some increased fatigue and cold sensitivity so she eventually restarted it she says she has been trying to be much more consistent with it lately though she did run out of her medication a couple of days ago.  She reports she still struggling some with her anxiety but says she is planning on getting out and exercising walking and running more.  She knows it would be helpful.  Still complains of a little bit of vaginal odor.  But is not interested in any further evaluation or treatment.   Past Medical History:  Diagnosis Date  . Allergy   . Anxiety   . GOA (generalized osteoarthritis)     No past surgical history on file.  Family History  Problem Relation Age of Onset  . Hypothyroidism Father   . Diabetes type II Father   . Diabetes type II Paternal Grandmother   . Hypothyroidism Maternal Grandmother     Social History   Socioeconomic History  . Marital status: Single    Spouse name: Not on file  . Number of children: Not on file  . Years of education: Not on file  . Highest education level: Not on file  Occupational History  . Occupation: unemployed   Tobacco Use  . Smoking status: Never Smoker  . Smokeless tobacco: Never Used  Substance and Sexual Activity  . Alcohol use: No  . Drug use: No  . Sexual activity: Yes  Other Topics Concern  . Not on file  Social History Narrative   Currently out of work after college. She does exercise.    Social  Determinants of Health   Financial Resource Strain:   . Difficulty of Paying Living Expenses:   Food Insecurity:   . Worried About Programme researcher, broadcasting/film/video in the Last Year:   . Barista in the Last Year:   Transportation Needs:   . Freight forwarder (Medical):   Marland Kitchen Lack of Transportation (Non-Medical):   Physical Activity:   . Days of Exercise per Week:   . Minutes of Exercise per Session:   Stress:   . Feeling of Stress :   Social Connections:   . Frequency of Communication with Friends and Family:   . Frequency of Social Gatherings with Friends and Family:   . Attends Religious Services:   . Active Member of Clubs or Organizations:   . Attends Banker Meetings:   Marland Kitchen Marital Status:   Intimate Partner Violence:   . Fear of Current or Ex-Partner:   . Emotionally Abused:   Marland Kitchen Physically Abused:   . Sexually Abused:     Outpatient Medications Prior to Visit  Medication Sig Dispense Refill  . levothyroxine (SYNTHROID) 100 MCG tablet Take 1 tablet (100 mcg total) by mouth daily for 15 days. 15 DAYS GIVEN. MUST HAVE APPOINTMENT AND LABS FOR REFILLS. 15 tablet 0   No facility-administered medications prior to  visit.    Allergies  Allergen Reactions  . Lamotrigine Rash  . Mirtazapine     Other reaction(s): Other Suicidal ideation, racing thoughts  . Paliperidone Er     Other reaction(s): Dizziness, Hypotension  . Aripiprazole Other (See Comments) and Rash    Muscle cramping, diarrhea, blurry vision,disorientation,heart racing,loss of dexterity in fingers/hands Muscle cramping, diarrhea, blurry vision,disorientation,heart racing,loss of dexterity in fingers/hands  . Penicillins     As a baby. She is unaware of the reaction.     ROS Review of Systems    Objective:    Physical Exam Constitutional:      Appearance: She is well-developed.  HENT:     Head: Normocephalic and atraumatic.  Cardiovascular:     Rate and Rhythm: Normal rate and regular  rhythm.     Heart sounds: Normal heart sounds.  Pulmonary:     Effort: Pulmonary effort is normal.     Breath sounds: Normal breath sounds.  Musculoskeletal:     Cervical back: Neck supple. No tenderness.  Lymphadenopathy:     Cervical: No cervical adenopathy.  Skin:    General: Skin is warm and dry.  Neurological:     Mental Status: She is alert and oriented to person, place, and time.  Psychiatric:        Behavior: Behavior normal.     BP 118/68   Pulse 97   Ht 5\' 8"  (1.727 m)   Wt 138 lb (62.6 kg)   LMP 02/10/2020 (Approximate)   SpO2 99%   BMI 20.98 kg/m  Wt Readings from Last 3 Encounters:  02/18/20 138 lb (62.6 kg)  08/15/19 144 lb (65.3 kg)  08/12/19 154 lb (69.9 kg)     There are no preventive care reminders to display for this patient.  There are no preventive care reminders to display for this patient.  Lab Results  Component Value Date   TSH 1.60 08/15/2019   Lab Results  Component Value Date   WBC 8.6 06/18/2018   HGB 13.2 06/18/2018   HCT 39.0 06/18/2018   MCV 86.5 06/18/2018   PLT 237 06/18/2018   Lab Results  Component Value Date   NA 140 08/15/2019   K 4.3 08/15/2019   CO2 29 08/15/2019   GLUCOSE 86 08/15/2019   BUN 9 08/15/2019   CREATININE 0.87 08/15/2019   BILITOT 0.6 08/15/2019   ALKPHOS 67 01/17/2017   AST 13 08/15/2019   ALT 11 08/15/2019   PROT 6.7 08/15/2019   ALBUMIN 4.3 01/17/2017   CALCIUM 9.4 08/15/2019   Lab Results  Component Value Date   CHOL 143 03/12/2018   Lab Results  Component Value Date   HDL 50 (L) 03/12/2018   Lab Results  Component Value Date   LDLCALC 74 03/12/2018   Lab Results  Component Value Date   TRIG 107 03/12/2018   Lab Results  Component Value Date   CHOLHDL 2.9 03/12/2018   Lab Results  Component Value Date   HGBA1C 5.1 06/18/2018      Assessment & Plan:   Problem List Items Addressed This Visit      Endocrine   Hypothyroid - Primary   Relevant Medications    levothyroxine (SYNTHROID) 100 MCG tablet   Other Relevant Orders   TSH    Other Visit Diagnoses    Vaginal odor         Vaginal odor odor-offered wet prep and potential treatment.  Patient declined and will let 13/11/2017 know if something  changes.  Anxiety-did encourage her to get more regular exercise and that studies do show improvement in symptoms control with routine exercise.  Meds ordered this encounter  Medications  . levothyroxine (SYNTHROID) 100 MCG tablet    Sig: Take 1 tablet (100 mcg total) by mouth daily.    Dispense:  90 tablet    Refill:  2    Follow-up: Return in about 9 months (around 11/18/2020) for thyroid check.    Nani Gasser, MD

## 2020-02-19 LAB — TSH: TSH: 0.75 mIU/L

## 2020-02-21 ENCOUNTER — Encounter: Payer: Self-pay | Admitting: Family Medicine

## 2020-05-07 ENCOUNTER — Other Ambulatory Visit: Payer: Self-pay

## 2020-05-07 ENCOUNTER — Ambulatory Visit (INDEPENDENT_AMBULATORY_CARE_PROVIDER_SITE_OTHER): Payer: Self-pay | Admitting: Family Medicine

## 2020-05-07 DIAGNOSIS — Z23 Encounter for immunization: Secondary | ICD-10-CM

## 2020-11-18 ENCOUNTER — Other Ambulatory Visit: Payer: Self-pay

## 2020-11-18 ENCOUNTER — Ambulatory Visit (INDEPENDENT_AMBULATORY_CARE_PROVIDER_SITE_OTHER): Payer: Self-pay | Admitting: Family Medicine

## 2020-11-18 ENCOUNTER — Other Ambulatory Visit: Payer: Self-pay | Admitting: Family Medicine

## 2020-11-18 ENCOUNTER — Encounter: Payer: Self-pay | Admitting: Family Medicine

## 2020-11-18 VITALS — BP 96/56 | HR 94 | Temp 98.5°F | Ht 68.0 in | Wt 140.0 lb

## 2020-11-18 DIAGNOSIS — R0981 Nasal congestion: Secondary | ICD-10-CM

## 2020-11-18 DIAGNOSIS — J029 Acute pharyngitis, unspecified: Secondary | ICD-10-CM

## 2020-11-18 DIAGNOSIS — E039 Hypothyroidism, unspecified: Secondary | ICD-10-CM

## 2020-11-18 DIAGNOSIS — F411 Generalized anxiety disorder: Secondary | ICD-10-CM

## 2020-11-18 MED ORDER — LEVOTHYROXINE SODIUM 100 MCG PO TABS: 100.0000 ug | ORAL_TABLET | Freq: Every day | ORAL | 2 refills | Status: DC

## 2020-11-18 NOTE — Assessment & Plan Note (Signed)
Due to recheck thyroid.  Will call with results.

## 2020-11-18 NOTE — Assessment & Plan Note (Signed)
Still reports some anxiety sxs on her intake form today.

## 2020-11-18 NOTE — Progress Notes (Signed)
Established Patient Office Visit  Subjective:  Patient ID: Kristina Black, female    DOB: 1991/06/13  Age: 30 y.o. MRN: [id number]  CC:  Chief Complaint  Patient presents with  . Hypothyroidism  . Sore Throat    X3 days denies f/s/c/n/v/d headache or body ache, no sick contacts     HPI [name] presents for   Hypothyroidism - Taking medication regularly in the AM away from food and vitamins, etc. No recent change to skin, hair.  Has been a little more tired latey but has working a lot.    She has been working from home and says she rarely leaves the house.  This visit is the most she has been around others in months.    She has had a mild ST and some nasal congestion for a couple of days. No cough, SOB or fever.  No [place] COVID exposure.    Past Medical History:  Diagnosis Date  . Allergy   . Anxiety     History reviewed. No pertinent surgical history.  Family History  Problem Relation Age of Onset  . Hypothyroidism Father   . Parkinson's disease Maternal Grandfather   . Diabetes type II Paternal Grandmother   . Hypothyroidism Maternal Grandmother     Social History   Socioeconomic History  . Marital status: Single    Spouse name: Not on file  . Number of children: Not on file  . Years of education: Not on file  . Highest education level: Not on file  Occupational History  . Occupation: unemployed   Tobacco Use  . Smoking status: Never Smoker  . Smokeless tobacco: Never Used  Substance and Sexual Activity  . Alcohol use: No  . Drug use: No  . Sexual activity: Yes  Other Topics Concern  . Not on file  Social History Narrative   Currently out of work after college. She does exercise.    Social Determinants of Health   Financial Resource Strain: Not on file  Food Insecurity: Not on file  Transportation Needs: Not on file  Physical Activity: Not on file  Stress: Not on file  Social Connections: Not on file  Intimate Partner Violence: Not on  file    Outpatient Medications Prior to Visit  Medication Sig Dispense Refill  . levothyroxine (SYNTHROID) 100 MCG tablet Take 1 tablet (100 mcg total) by mouth daily. 90 tablet 2   No facility-administered medications prior to visit.    Allergies  Allergen Reactions  . Lamotrigine Rash  . Mirtazapine     Other reaction(s): Other Suicidal ideation, racing thoughts  . Paliperidone Er     Other reaction(s): Dizziness, Hypotension  . Aripiprazole Other (See Comments) and Rash    Muscle cramping, diarrhea, blurry vision,disorientation,heart racing,loss of dexterity in fingers/hands Muscle cramping, diarrhea, blurry vision,disorientation,heart racing,loss of dexterity in fingers/hands  . Penicillins     As a baby. She is unaware of the reaction.     ROS Review of Systems    Objective:    Physical Exam Constitutional:      Appearance: She is well-developed.  HENT:     Head: Normocephalic and atraumatic.     Right Ear: Tympanic membrane, ear canal and external ear normal.     Left Ear: Tympanic membrane, ear canal and external ear normal.     Nose: Nose normal.     Mouth/Throat:     Mouth: Mucous membranes are moist.     Pharynx: Oropharynx is clear. No  oropharyngeal exudate or posterior oropharyngeal erythema.  Eyes:     Conjunctiva/sclera: Conjunctivae normal.     Pupils: Pupils are equal, round, and reactive to light.  Neck:     Thyroid: No thyromegaly.  Cardiovascular:     Rate and Rhythm: Normal rate and regular rhythm.     Heart sounds: Normal heart sounds.  Pulmonary:     Effort: Pulmonary effort is normal.     Breath sounds: Normal breath sounds. No wheezing.  Musculoskeletal:     Cervical back: Neck supple.  Lymphadenopathy:     Cervical: No cervical adenopathy.  Skin:    General: Skin is warm and dry.  Neurological:     Mental Status: She is alert and oriented to person, place, and time.     BP (!) 96/56   Pulse 94   Temp 98.5 F (36.9 C)   Ht 5'  8" (1.727 m)   Wt 140 lb (63.5 kg)   LMP 10/28/2020 (Approximate)   SpO2 100%   BMI 21.29 kg/m  Wt Readings from Last 3 Encounters:  11/18/20 140 lb (63.5 kg)  02/18/20 138 lb (62.6 kg)  08/15/19 144 lb (65.3 kg)     There are no preventive care reminders to display for this patient.  There are no preventive care reminders to display for this patient.  Lab Results  Component Value Date   TSH 0.75 02/18/2020   Lab Results  Component Value Date   WBC 8.6 06/18/2018   HGB 13.2 06/18/2018   HCT 39.0 06/18/2018   MCV 86.5 06/18/2018   PLT 237 06/18/2018   Lab Results  Component Value Date   NA 140 08/15/2019   K 4.3 08/15/2019   CO2 29 08/15/2019   GLUCOSE 86 08/15/2019   BUN 9 08/15/2019   CREATININE 0.87 08/15/2019   BILITOT 0.6 08/15/2019   ALKPHOS 67 01/17/2017   AST 13 08/15/2019   ALT 11 08/15/2019   PROT 6.7 08/15/2019   ALBUMIN 4.3 01/17/2017   CALCIUM 9.4 08/15/2019   Lab Results  Component Value Date   CHOL 143 03/12/2018   Lab Results  Component Value Date   HDL 50 (L) 03/12/2018   Lab Results  Component Value Date   LDLCALC 74 03/12/2018   Lab Results  Component Value Date   TRIG 107 03/12/2018   Lab Results  Component Value Date   CHOLHDL 2.9 03/12/2018   Lab Results  Component Value Date   HGBA1C 5.1 06/18/2018      Assessment & Plan:   Problem List Items Addressed This Visit      Endocrine   Hypothyroid - Primary    Due to recheck thyroid.  Will call with results.        Relevant Medications   levothyroxine (SYNTHROID) 100 MCG tablet   Other Relevant Orders   TSH     Other   GAD (generalized anxiety disorder)    Still reports some anxiety sxs on her intake form today.        Other Visit Diagnoses    Sore throat       Nasal congestion          ST/Nasal congestion - mild URI vs allergies, though she reports she doesn't have typical spring allergies.  If sxs not improving after one week or getting worse then  please call back.     Meds ordered this encounter  Medications  . levothyroxine (SYNTHROID) 100 MCG tablet    Sig: Take  1 tablet (100 mcg total) by mouth daily.    Dispense:  90 tablet    Refill:  2    Follow-up: Return in about 1 year (around 11/18/2021) for thyroid .    Nani Gasser, MD

## 2020-11-19 ENCOUNTER — Encounter: Payer: Self-pay | Admitting: Family Medicine

## 2020-11-26 LAB — TSH: TSH: 1.19 mIU/L

## 2020-11-26 NOTE — Progress Notes (Signed)
Thyroid looks great. Looks like you have refills. Hope you have a great weekend.   Tandy Gaw PA-C

## 2021-08-29 ENCOUNTER — Other Ambulatory Visit: Payer: Self-pay | Admitting: Family Medicine

## 2021-08-29 DIAGNOSIS — E039 Hypothyroidism, unspecified: Secondary | ICD-10-CM

## 2021-09-28 ENCOUNTER — Encounter: Payer: Self-pay | Admitting: Family Medicine

## 2021-09-28 ENCOUNTER — Ambulatory Visit (INDEPENDENT_AMBULATORY_CARE_PROVIDER_SITE_OTHER): Payer: Self-pay | Admitting: Family Medicine

## 2021-09-28 ENCOUNTER — Other Ambulatory Visit: Payer: Self-pay

## 2021-09-28 VITALS — BP 111/53 | HR 81 | Resp 16 | Ht 68.0 in | Wt 147.0 lb

## 2021-09-28 DIAGNOSIS — R5383 Other fatigue: Secondary | ICD-10-CM

## 2021-09-28 DIAGNOSIS — E039 Hypothyroidism, unspecified: Secondary | ICD-10-CM

## 2021-09-28 DIAGNOSIS — L309 Dermatitis, unspecified: Secondary | ICD-10-CM

## 2021-09-28 MED ORDER — LEVOTHYROXINE SODIUM 100 MCG PO TABS: 100.0000 ug | ORAL_TABLET | Freq: Every day | ORAL | 1 refills | Status: DC

## 2021-09-28 NOTE — Assessment & Plan Note (Signed)
Plan to recheck TSH.  She did have some questions around pregnancy and taking thyroid medications we discussed that it is highly recommended to continue medication and in fact it is often the case that her dose would need to be adjusted if she were to become pregnant.  She is not currently planning on starting a family but just had some questions surrounding that.

## 2021-09-28 NOTE — Progress Notes (Signed)
Established Patient Office Visit  Subjective:  Patient ID: Kristina Black, female    DOB: 11/28/1990  Age: 31 y.o. MRN: [id number]  CC:  Chief Complaint  Patient presents with   Follow-up    Hypothyroid. Discuss Levothyroxine    Rash    On arms    HPI Kristina Black presents for rash  -she says she has been washing her hands a lot she usually wash them and then apply hand sanitizer even at home and she is also been using some exfoliating gloves on her arms and so has some very dry skin and and some fine scabbing.  Hypothyroidism - Taking medication regularly in the AM away from food and vitamins, etc. No recent change to skin, hair, or energy levels.  She did have some questions about treating the thyroid and being pregnant and CBD as an option for treatment as well.  She also reports feeling significantly more exhausted and "sick" during her menstrual periods are still fairly regular she does not feel like she is bleeding more than usual.  But is just noticed over the last 6 months that she does not feel well during her menstrual cycle.  Is not currently taking any medications or supplements.  Past Medical History:  Diagnosis Date   Allergy    Anxiety     No past surgical history on file.  Family History  Problem Relation Age of Onset   Hypothyroidism Father    Parkinson's disease Maternal Grandfather    Diabetes type II Paternal Grandmother    Hypothyroidism Maternal Grandmother     Social History   Socioeconomic History   Marital status: Single    Spouse name: Not on file   Number of children: Not on file   Years of education: Not on file   Highest education level: Not on file  Occupational History   Occupation: employed  Tobacco Use   Smoking status: Never   Smokeless tobacco: Never  Substance and Sexual Activity   Alcohol use: No   Drug use: No   Sexual activity: Yes  Other Topics Concern   Not on file  Social History Narrative   She does exercise.     Social Determinants of Health   Financial Resource Strain: Not on file  Food Insecurity: Not on file  Transportation Needs: Not on file  Physical Activity: Not on file  Stress: Not on file  Social Connections: Not on file  Intimate Partner Violence: Not on file    Outpatient Medications Prior to Visit  Medication Sig Dispense Refill   levothyroxine (SYNTHROID) 100 MCG tablet Take 1 tablet (100 mcg total) by mouth daily. NEEDS LABS/APPT FOR FUTURE REFILLS. 30 tablet 0   No facility-administered medications prior to visit.    Allergies  Allergen Reactions   Lamotrigine Rash   Mirtazapine     Other reaction(s): Other Suicidal ideation, racing thoughts   Paliperidone Er     Other reaction(s): Dizziness, Hypotension   Aripiprazole Rash and Other (See Comments)    Muscle cramping, diarrhea, blurry vision,disorientation,heart racing,loss of dexterity in fingers/hands    Penicillins     As a baby. She is unaware of the reaction.     ROS Review of Systems    Objective:    Physical Exam Constitutional:      Appearance: Normal appearance. She is well-developed.  HENT:     Head: Normocephalic and atraumatic.  Cardiovascular:     Rate and Rhythm: Normal rate and regular rhythm.  Heart sounds: Normal heart sounds.  Pulmonary:     Effort: Pulmonary effort is normal.     Breath sounds: Normal breath sounds.  Musculoskeletal:     Cervical back: Neck supple. No tenderness.  Skin:    General: Skin is warm and dry.  Neurological:     Mental Status: She is alert and oriented to person, place, and time.  Psychiatric:        Behavior: Behavior normal.    BP (!) 111/53    Pulse 81    Resp 16    Ht 5\' 8"  (1.727 m)    Wt 147 lb (66.7 kg)    LMP 09/14/2021    SpO2 96%    BMI 22.35 kg/m  Wt Readings from Last 3 Encounters:  09/28/21 147 lb (66.7 kg)  11/18/20 140 lb (63.5 kg)  02/18/20 138 lb (62.6 kg)     Health Maintenance Due  Topic Date Due   Hepatitis C  Screening  Never done   PAP SMEAR-Modifier  01/18/2020    There are no preventive care reminders to display for this patient.  Lab Results  Component Value Date   TSH 1.19 11/25/2020   Lab Results  Component Value Date   WBC 8.6 06/18/2018   HGB 13.2 06/18/2018   HCT 39.0 06/18/2018   MCV 86.5 06/18/2018   PLT 237 06/18/2018   Lab Results  Component Value Date   NA 140 08/15/2019   K 4.3 08/15/2019   CO2 29 08/15/2019   GLUCOSE 86 08/15/2019   BUN 9 08/15/2019   CREATININE 0.87 08/15/2019   BILITOT 0.6 08/15/2019   ALKPHOS 67 01/17/2017   AST 13 08/15/2019   ALT 11 08/15/2019   PROT 6.7 08/15/2019   ALBUMIN 4.3 01/17/2017   CALCIUM 9.4 08/15/2019   Lab Results  Component Value Date   CHOL 143 03/12/2018   Lab Results  Component Value Date   HDL 50 (L) 03/12/2018   Lab Results  Component Value Date   LDLCALC 74 03/12/2018   Lab Results  Component Value Date   TRIG 107 03/12/2018   Lab Results  Component Value Date   CHOLHDL 2.9 03/12/2018   Lab Results  Component Value Date   HGBA1C 5.1 06/18/2018      Assessment & Plan:   Problem List Items Addressed This Visit       Endocrine   Hypothyroid    Plan to recheck TSH.  She did have some questions around pregnancy and taking thyroid medications we discussed that it is highly recommended to continue medication and in fact it is often the case that her dose would need to be adjusted if she were to become pregnant.  She is not currently planning on starting a family but just had some questions surrounding that.      Relevant Medications   levothyroxine (SYNTHROID) 100 MCG tablet   Other Relevant Orders   TSH   Other Visit Diagnoses     Other fatigue    -  Primary   Relevant Orders   CBC   Dermatitis           Dermatitis-encouraged her to not exfoliate just to use her fingertips and soap and rinse.  Either choose to wash hands or use hand sanitizer but try not to do both.  If she does tend  to have some keratin buildup then recommend a product such as Cereve which can moisturize and also be a care to limit.  Fatigue around her menses-suspect that she may be anemic.  Recommend doing a CBC for further work-up.  Meds ordered this encounter  Medications   levothyroxine (SYNTHROID) 100 MCG tablet    Sig: Take 1 tablet (100 mcg total) by mouth daily. NEEDS LABS/APPT FOR FUTURE REFILLS.    Dispense:  30 tablet    Refill:  1    Follow-up: No follow-ups on file.    Beatrice Lecher, MD

## 2021-09-29 LAB — CBC
HCT: 38.3 % (ref 35.0–45.0)
Hemoglobin: 12.9 g/dL (ref 11.7–15.5)
MCH: 29.5 pg (ref 27.0–33.0)
MCHC: 33.7 g/dL (ref 32.0–36.0)
MCV: 87.4 fL (ref 80.0–100.0)
MPV: 12.3 fL (ref 7.5–12.5)
Platelets: 187 10*3/uL (ref 140–400)
RBC: 4.38 10*6/uL (ref 3.80–5.10)
RDW: 13.6 % (ref 11.0–15.0)
WBC: 5.5 10*3/uL (ref 3.8–10.8)

## 2021-09-29 LAB — TSH: TSH: 1.74 mIU/L

## 2021-09-29 NOTE — Progress Notes (Signed)
Your blood count looks great.  No sign of anemia or infection which looks good.  Your thyroid is perfect at 1.7.  Please let us know if you do decide to get your Pap smear updated.

## 2022-01-03 ENCOUNTER — Encounter: Payer: Medicaid Other | Admitting: Family Medicine

## 2022-02-20 ENCOUNTER — Other Ambulatory Visit: Payer: Self-pay | Admitting: Family Medicine

## 2022-02-20 DIAGNOSIS — E039 Hypothyroidism, unspecified: Secondary | ICD-10-CM

## 2022-03-01 ENCOUNTER — Ambulatory Visit
Admission: EM | Admit: 2022-03-01 | Discharge: 2022-03-01 | Disposition: A | Discharge status: Home / Self Care | Payer: Medicaid Other | Attending: Family Medicine | Admitting: Family Medicine

## 2022-03-01 DIAGNOSIS — J069 Acute upper respiratory infection, unspecified: Secondary | ICD-10-CM | POA: Insufficient documentation

## 2022-03-01 DIAGNOSIS — J029 Acute pharyngitis, unspecified: Secondary | ICD-10-CM | POA: Insufficient documentation

## 2022-03-01 HISTORY — DX: Disorder of thyroid, unspecified: E07.9

## 2022-03-01 LAB — POCT RAPID STREP A (OFFICE): Rapid Strep A Screen: NEGATIVE

## 2022-03-01 NOTE — ED Triage Notes (Signed)
Pt presents with c/o sore throat, fever, and cough that began yesterday.

## 2022-03-01 NOTE — Discharge Instructions (Signed)
Strep test is negative You can do covid test at home Call back if it is positive Rest and push fluids I believe this is one of the common cold viruses and will go away with home treatment

## 2022-03-01 NOTE — ED Provider Notes (Signed)
[facility]    CSN: [id number] Arrival date & time: 03/01/22  1253      History   Chief Complaint Chief Complaint  Patient presents with   Fever   Cough   Sore Throat    HPI Kristina Black is a 31 y.o. female.   HPI  Patient is here for sore throat, runny nose, slight cough and chest tightness.  No underlying asthma.  She works in a daycare.  She is around a lot of small children with typical daycare infectious disease exposure. 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 No pertinent surgical history.  OB History   No obstetric history on file.      Home Medications    Prior to Admission medications   Medication Sig Start Date End Date Taking? Authorizing Provider  levothyroxine (SYNTHROID) 100 MCG tablet Take 1 tablet (100 mcg total) by mouth daily. 02/23/22   Agapito Games, MD    Family History Family History  Problem Relation Age of Onset   Hypothyroidism Father    Parkinson's disease Maternal Grandfather    Diabetes type II Paternal Grandmother    Hypothyroidism Maternal Grandmother     Social History Social History   Tobacco Use   Smoking status: Never   Smokeless tobacco: Never  Substance Use Topics   Alcohol use: No   Drug use: No     Allergies   Lamotrigine, Mirtazapine, Paliperidone er, Aripiprazole, and Penicillins   Review of Systems Review of Systems  See HPI Physical Exam Triage Vital Signs ED Triage Vitals  Enc  Vitals Group     BP 03/01/22 1308 109/70     Pulse Rate 03/01/22 1308 85     Resp 03/01/22 1308 14     Temp 03/01/22 1308 98.9 F (37.2 C)     Temp Source 03/01/22 1308 Oral     SpO2 03/01/22 1308 99 %     Weight 03/01/22 1307 145 lb (65.8 kg)     Height --      Head Circumference --      Peak Flow --      Pain Score 03/01/22 1306 6     Pain Loc --      Pain Edu? --      Excl. in GC? --    No data found.  Updated Vital Signs BP 109/70 (BP Location: Right Arm)   Pulse 85   Temp 98.9 F (37.2 C) (Oral)   Resp 14   Wt 65.8 kg   SpO2 99%   BMI 22.05 kg/m       Physical Exam Constitutional:      General: She is not in acute distress.    Appearance: She is well-developed. She is not ill-appearing.  HENT:     Head: Normocephalic and atraumatic.     Right Ear: Tympanic membrane and ear canal normal.     Left Ear: Tympanic membrane and ear canal normal.  Nose: Nose normal.     Comments: Clear rhinorrhea    Mouth/Throat:     Mouth: Mucous membranes are moist.     Pharynx: Posterior oropharyngeal erythema present.     Comments: Erythema posterior pharynx.  No swelling or exudate Eyes:     Conjunctiva/sclera: Conjunctivae normal.     Pupils: Pupils are equal, round, and reactive to light.  Cardiovascular:     Rate and Rhythm: Normal rate.  Pulmonary:     Effort: Pulmonary effort is normal. No respiratory distress.  Abdominal:     General: There is no distension.     Palpations: Abdomen is soft.  Musculoskeletal:        General: Normal range of motion.     Cervical back: Normal range of motion.  Lymphadenopathy:     Cervical: No cervical adenopathy.  Skin:    General: Skin is warm and dry.  Neurological:     Mental Status: She is alert.  Psychiatric:        Mood and Affect: Mood normal.        Behavior: Behavior normal.      UC Treatments / Results  Labs (all labs ordered are listed, but only abnormal results are displayed) Labs Reviewed  CULTURE,  GROUP A STREP [facility])  POCT RAPID STREP A (OFFICE)    EKG   Radiology No results found.  Procedures Procedures (including critical care time)  Medications Ordered in UC Medications - No data to display  Initial Impression / Assessment and Plan / UC Course  I have reviewed the triage vital signs and the nursing notes.  Pertinent labs & imaging results that were available during my care of the patient were reviewed by me and considered in my medical decision making (see chart for details).     Patient likely has a viral upper respiratory infection.  Strep testing negative.  Patient can do COVID testing at home (concern for cost).  Follow-up with primary care Final Clinical Impressions(s) / UC Diagnoses   Final diagnoses:  Sore throat  Viral URI with cough     Discharge Instructions      Strep test is negative You can do covid test at home Call back if it is positive Rest and push fluids I believe this is one of the common cold viruses and will go away with home treatment   ED Prescriptions   None    PDMP not reviewed this encounter.   Eustace Moore, MD 03/01/22 [phone]

## 2022-03-03 LAB — CULTURE, GROUP A STREP (THRC)

## 2022-04-23 ENCOUNTER — Other Ambulatory Visit: Payer: Self-pay | Admitting: Family Medicine

## 2022-04-23 DIAGNOSIS — E039 Hypothyroidism, unspecified: Secondary | ICD-10-CM

## 2022-06-20 ENCOUNTER — Other Ambulatory Visit: Payer: Self-pay | Admitting: Family Medicine

## 2022-06-20 DIAGNOSIS — E039 Hypothyroidism, unspecified: Secondary | ICD-10-CM

## 2022-06-23 ENCOUNTER — Ambulatory Visit
Admission: EM | Admit: 2022-06-23 | Discharge: 2022-06-23 | Disposition: A | Discharge status: Home / Self Care | Payer: BC Managed Care – PPO | Attending: Family Medicine | Admitting: Family Medicine

## 2022-06-23 DIAGNOSIS — Z1152 Encounter for screening for COVID-19: Secondary | ICD-10-CM | POA: Diagnosis not present

## 2022-06-23 DIAGNOSIS — R0789 Other chest pain: Secondary | ICD-10-CM | POA: Insufficient documentation

## 2022-06-23 DIAGNOSIS — R059 Cough, unspecified: Secondary | ICD-10-CM | POA: Diagnosis present

## 2022-06-23 DIAGNOSIS — J309 Allergic rhinitis, unspecified: Secondary | ICD-10-CM | POA: Insufficient documentation

## 2022-06-23 LAB — RESP PANEL BY RT-PCR (FLU A&B, COVID) ARPGX2
Influenza A by PCR: NEGATIVE
Influenza B by PCR: NEGATIVE
SARS Coronavirus 2 by RT PCR: NEGATIVE

## 2022-06-23 LAB — POCT RAPID STREP A (OFFICE): Rapid Strep A Screen: NEGATIVE

## 2022-06-23 NOTE — ED Triage Notes (Signed)
Pt c/o cough and congestion since last Thurs. Some chest tightness. Denies fever. No OTC meds tried. Herbal supplements.

## 2022-06-23 NOTE — Discharge Instructions (Addendum)
Advised patient strep pharyngitis was negative today.  Advised patient may take OTC Allegra daily for 5 days for concurrent postnasal drip/drainage.  Advised patient we will follow-up with COVID-19/influenza results once received.  Advised patient if symptoms worsen and/or unresolved please follow-up with PCP, local ED or here for further evaluation.

## 2022-06-23 NOTE — ED Provider Notes (Signed)
[facility]    CSN: [id number] Arrival date & time: 06/23/22  1531      History   Chief Complaint Chief Complaint  Patient presents with   Cough   Chest Pain    Tightness    HPI Zaiah Credeur is a 31 y.o. female.   HPI 31 year old female presents with cough and congestion for 1 week.  Patient reports intermittent chest tightness and has tried herbal supplements with minimal to no relief.  PMH significant for trichotillomania, bipolar-affective disorder, and panic disorder.  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 No pertinent surgical history.  OB History   No obstetric history on file.      Home Medications    Prior to Admission medications   Medication Sig Start Date End Date Taking? Authorizing Provider  levothyroxine (SYNTHROID) 100 MCG tablet TAKE 1 TABLET BY MOUTH EVERY DAY 06/21/22   Agapito Games, MD    Family History Family History  Problem Relation Age of Onset   Hypothyroidism Father    Parkinson's disease Maternal Grandfather    Diabetes type II Paternal Grandmother    Hypothyroidism Maternal Grandmother     Social History Social History   Tobacco Use   Smoking status: Never   Smokeless tobacco: Never  Substance Use Topics   Alcohol use: No   Drug use: No     Allergies   Lamotrigine, Mirtazapine, Paliperidone er, Aripiprazole, and Penicillins   Review of Systems Review of Systems  HENT:  Positive for congestion.   Respiratory:  Positive for cough.   All other systems reviewed and are  negative.    Physical Exam Triage Vital Signs ED Triage Vitals  Enc Vitals Group     BP 06/23/22 1558 118/66     Pulse Rate 06/23/22 1558 99     Resp 06/23/22 1558 17     Temp 06/23/22 1558 98.9 F (37.2 C)     Temp Source 06/23/22 1558 Oral     SpO2 06/23/22 1558 94 %     Weight --      Height --      Head Circumference --      Peak Flow --      Pain Score 06/23/22 1600 0     Pain Loc --      Pain Edu? --      Excl. in GC? --    No data found.  Updated Vital Signs BP 118/66 (BP Location: Left Arm)   Pulse 99   Temp 98.9 F (37.2 C) (Oral)   Resp 17   LMP 06/08/2022 (Approximate)   SpO2 94%       Physical Exam Vitals and nursing note reviewed.  Constitutional:      Appearance: Normal appearance. She is normal weight.  HENT:     Head: Normocephalic and atraumatic.     Right Ear: Tympanic membrane, ear canal and external ear normal.     Left Ear: Tympanic membrane, ear canal and external ear normal.     Mouth/Throat:  Mouth: Mucous membranes are moist.     Pharynx: Oropharynx is clear.     Comments: Moderate amount of clear drainage of posterior oropharynx noted Eyes:     Extraocular Movements: Extraocular movements intact.     Conjunctiva/sclera: Conjunctivae normal.     Pupils: Pupils are equal, round, and reactive to light.  Cardiovascular:     Rate and Rhythm: Normal rate and regular rhythm.     Pulses: Normal pulses.     Heart sounds: Normal heart sounds.  Pulmonary:     Effort: Pulmonary effort is normal.     Breath sounds: Normal breath sounds. No wheezing, rhonchi or rales.  Musculoskeletal:        General: Normal range of motion.     Cervical back: Normal range of motion and neck supple.  Skin:    General: Skin is warm and dry.  Neurological:     General: No focal deficit present.     Mental Status: She is alert and oriented to person, place, and time.      UC Treatments / Results  Labs (all labs ordered are listed, but only abnormal  results are displayed) Labs Reviewed  RESP PANEL BY RT-PCR (FLU A&B, COVID) [id number]  POCT RAPID STREP A (OFFICE)    EKG   Radiology No results found.  Procedures Procedures (including critical care time)  Medications Ordered in UC Medications - No data to display  Initial Impression / Assessment and Plan / UC Course  I have reviewed the triage vital signs and the nursing notes.  Pertinent labs & imaging results that were available during my care of the patient were reviewed by me and considered in my medical decision making (see chart for details).     MDM: 1.  Allergic rhinitis-Advised patient strep pharyngitis was negative today.  Advised patient may take OTC Allegra daily for 5 days for concurrent postnasal drip/drainage.  Advised patient we will follow-up with COVID-19/influenza results once received.  Advised patient if symptoms worsen and/or unresolved please follow-up with PCP, local ED or here for further evaluation. Final Clinical Impressions(s) / UC Diagnoses   Final diagnoses:  Allergic rhinitis, unspecified seasonality, unspecified trigger     Discharge Instructions      Advised patient strep pharyngitis was negative today.  Advised patient may take OTC Allegra daily for 5 days for concurrent postnasal drip/drainage.  Advised patient we will follow-up with COVID-19/influenza results once received.  Advised patient if symptoms worsen and/or unresolved please follow-up with PCP, local ED or here for further evaluation.     ED Prescriptions   None    PDMP not reviewed this encounter.   Trevor Iha, FNP 06/23/22 1739

## 2022-08-22 ENCOUNTER — Ambulatory Visit
Admission: EM | Admit: 2022-08-22 | Discharge: 2022-08-22 | Disposition: A | Discharge status: Home / Self Care | Payer: BC Managed Care – PPO

## 2022-08-22 DIAGNOSIS — J029 Acute pharyngitis, unspecified: Secondary | ICD-10-CM | POA: Diagnosis not present

## 2022-08-22 NOTE — ED Triage Notes (Signed)
Pt also endorses congestion and body aches.

## 2022-08-22 NOTE — ED Triage Notes (Signed)
Pt presents with c/o sore throat that began Thursday.

## 2022-08-22 NOTE — Discharge Instructions (Addendum)
Advised patient OTC Tylenol/Ibuprofen daily as needed for sore throat pain.  Encouraged increase daily water intake when taking these OTC medications.  Advised patient if symptoms worsen and or unresolved please follow-up with PCP or here for further evaluation.

## 2022-08-22 NOTE — ED Provider Notes (Signed)
[facility]    CSN: [id number] Arrival date & time: 08/22/22  1027      History   Chief Complaint Chief Complaint  Patient presents with   Sore Throat    HPI Lakeria Starkman is a 32 y.o. female.   HPI 33 year old female presents with raw sore throat and request for work note for today.  Anxiety, panic disorder and trichotillomania.  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 No pertinent surgical history.  OB History   No obstetric history on file.      Home Medications    Prior to Admission medications   Medication Sig Start Date End Date Taking? Authorizing Provider  levothyroxine (SYNTHROID) 100 MCG tablet TAKE 1 TABLET BY MOUTH EVERY DAY 06/21/22   Hali Marry, MD    Family History Family History  Problem Relation Age of Onset   Hypothyroidism Father    Parkinson's disease Maternal Grandfather    Diabetes type II Paternal Grandmother    Hypothyroidism Maternal Grandmother     Social History Social History   Tobacco Use   Smoking status: Never   Smokeless tobacco: Never  Substance Use Topics   Alcohol use: No   Drug use: No     Allergies   Lamotrigine, Mirtazapine, Paliperidone er, Aripiprazole, and Penicillins   Review of Systems Review of Systems  HENT:  Positive for sore throat.   All other systems reviewed and are negative.    Physical Exam Triage Vital Signs ED Triage Vitals  Enc Vitals Group     BP 08/22/22 1039 104/73     Pulse Rate 08/22/22 1039 74     Resp 08/22/22 1039 14     Temp 08/22/22  1039 97.9 F (36.6 C)     Temp Source 08/22/22 1039 Oral     SpO2 08/22/22 1039 100 %     Weight --      Height --      Head Circumference --      Peak Flow --      Pain Score 08/22/22 1036 5     Pain Loc --      Pain Edu? --      Excl. in [place]? --    No data found.  Updated Vital Signs BP 104/73 (BP Location: Right Arm)   Pulse 74   Temp 97.9 F (36.6 C) (Oral)   Resp 14   SpO2 100%   Visual Acuity Right Eye Distance:   Left Eye Distance:   Bilateral Distance:    Right Eye Near:   Left Eye Near:    Bilateral Near:     Physical Exam Vitals and nursing note reviewed.  Constitutional:      Appearance: She is well-developed and normal weight.  HENT:     Head: Normocephalic and atraumatic.     Right Ear: Tympanic membrane and ear canal normal.     Left Ear: Tympanic membrane and ear canal normal.     Mouth/Throat:     Mouth: Mucous membranes are moist.  Pharynx: Oropharynx is clear. Uvula midline.  Eyes:     Conjunctiva/sclera: Conjunctivae normal.     Pupils: Pupils are equal, round, and reactive to light.  Cardiovascular:     Rate and Rhythm: Normal rate and regular rhythm.     Heart sounds: Normal heart sounds. No murmur heard. Pulmonary:     Effort: Pulmonary effort is normal.     Breath sounds: Normal breath sounds.  Musculoskeletal:     Cervical back: Normal range of motion and neck supple.  Skin:    General: Skin is warm and dry.  Neurological:     General: No focal deficit present.     Mental Status: She is alert and oriented to person, place, and time.      UC Treatments / Results  Labs (all labs ordered are listed, but only abnormal results are displayed) Labs Reviewed - No data to display  EKG   Radiology No results found.  Procedures Procedures (including critical care time)  Medications Ordered in UC Medications - No data to display  Initial Impression / Assessment and Plan / UC Course  I have reviewed the triage vital signs  and the nursing notes.  Pertinent labs & imaging results that were available during my care of the patient were reviewed by me and considered in my medical decision making (see chart for details).     MDM: 1.  Sore throat-Advised patient OTC Tylenol/Ibuprofen daily as needed for sore throat pain.  Encouraged increase daily water intake when taking these OTC medications. Advised patient if symptoms worsen and or unresolved please follow-up with PCP or here for further evaluation.  Work note provided to patient per request.  Patient discharged home, hemodynamically stable. Final Clinical Impressions(s) / UC Diagnoses   Final diagnoses:  Sore throat     Discharge Instructions      Advised patient OTC Tylenol/Ibuprofen daily as needed for sore throat pain.  Encouraged increase daily water intake when taking these OTC medications.  Advised patient if symptoms worsen and or unresolved please follow-up with PCP or here for further evaluation.     ED Prescriptions   None    PDMP not reviewed this encounter.   Trevor Iha, FNP 08/22/22 1130

## 2022-09-18 ENCOUNTER — Other Ambulatory Visit: Payer: Self-pay | Admitting: Family Medicine

## 2022-09-18 DIAGNOSIS — E039 Hypothyroidism, unspecified: Secondary | ICD-10-CM

## 2022-12-14 ENCOUNTER — Other Ambulatory Visit: Payer: Self-pay | Admitting: Family Medicine

## 2022-12-14 DIAGNOSIS — E039 Hypothyroidism, unspecified: Secondary | ICD-10-CM

## 2022-12-14 NOTE — Telephone Encounter (Signed)
Pt will need an appointment and labs for refills.

## 2022-12-14 NOTE — Telephone Encounter (Signed)
Lvm for patient to call back to schedule an appointment for labs for refills. Tvt

## 2022-12-15 ENCOUNTER — Ambulatory Visit
Admission: EM | Admit: 2022-12-15 | Discharge: 2022-12-15 | Disposition: A | Discharge status: Home / Self Care | Payer: BC Managed Care – PPO | Attending: Family Medicine | Admitting: Family Medicine

## 2022-12-15 ENCOUNTER — Encounter: Payer: Self-pay | Admitting: Emergency Medicine

## 2022-12-15 DIAGNOSIS — J020 Streptococcal pharyngitis: Secondary | ICD-10-CM | POA: Diagnosis not present

## 2022-12-15 LAB — POCT INFLUENZA A/B
Influenza A, POC: NEGATIVE
Influenza B, POC: NEGATIVE

## 2022-12-15 LAB — POC SARS CORONAVIRUS 2 AG -  ED: SARS Coronavirus 2 Ag: NEGATIVE

## 2022-12-15 LAB — POCT RAPID STREP A (OFFICE): Rapid Strep A Screen: POSITIVE — AB

## 2022-12-15 MED ORDER — CEPHALEXIN 500 MG PO CAPS: 500.0000 mg | ORAL_CAPSULE | Freq: Two times a day (BID) | ORAL | 0 refills | Status: DC

## 2022-12-15 NOTE — ED Triage Notes (Signed)
Pt c/o sore throat, head ache and body aches the last few days but worse today. No meds taken.

## 2022-12-15 NOTE — ED Provider Notes (Signed)
[facility]    CSN: [id number] Arrival date & time: 12/15/22  1838      History   Chief Complaint Chief Complaint  Patient presents with   Sore Throat    HPI Kristina Black is a 32 y.o. female.   HPI  Patient has a sore throat, headache, body aches that is getting worse over the last few days.  Today is the worst day.  No fever or chills.  No runny nose or cough.  No known exposure to illness.  Painful to swallow.  States she can take sips of liquids.  No appetite.  She is not having any runny stuffy nose, sinus congestion, or cough however states that her chest feels tight.  Not short of breath.  No history of wheezing.   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 No pertinent surgical history.  OB History   No obstetric history on file.      Home Medications    Prior to Admission medications   Medication Sig Start Date End Date Taking? Authorizing Provider  cephALEXin (KEFLEX) 500 MG capsule Take 1 capsule (500 mg total) by mouth 2 (two) times daily. 12/15/22  Yes Eustace Moore, MD  levothyroxine (SYNTHROID) 100 MCG tablet Take 1 tablet (100 mcg total) by mouth daily. 30 day supply. Must schedule an appointment with labs for refills 12/14/22   Agapito Games, MD    Family History Family History  Problem Relation Age of Onset   Hypothyroidism Father    Parkinson's disease Maternal Grandfather    Diabetes type II Paternal Grandmother    Hypothyroidism Maternal Grandmother     Social History Social History   Tobacco Use    Smoking status: Never   Smokeless tobacco: Never  Substance Use Topics   Alcohol use: No   Drug use: No     Allergies   Lamotrigine, Mirtazapine, Paliperidone er, Aripiprazole, and Penicillins   Review of Systems Review of Systems  See HPI Physical Exam Triage Vital Signs ED Triage Vitals  Enc Vitals Group     BP 12/15/22 1852 105/68     Pulse Rate 12/15/22 1852 90     Resp 12/15/22 1852 16     Temp 12/15/22 1852 98.5 F (36.9 C)     Temp Source 12/15/22 1852 Oral     SpO2 12/15/22 1852 99 %     Weight --      Height --      Head Circumference --      Peak Flow --      Pain Score 12/15/22 1853 7     Pain Loc --      Pain Edu? --      Excl. in [place]? --    No data found.  Updated Vital Signs BP 105/68 (BP Location: Right Arm)   Pulse 90   Temp 98.5 F (36.9 C) (Oral)   Resp 16   LMP 11/17/2022 (Approximate)   SpO2 99%       Physical Exam Vitals reviewed.  Constitutional:      General: She is not in acute distress.    Appearance: She is well-developed. She is ill-appearing.  HENT:     Head: Normocephalic and atraumatic.     Right Ear: Tympanic membrane normal.     Left Ear: Tympanic membrane normal.     Nose: No congestion or rhinorrhea.     Mouth/Throat:     Mouth: Mucous membranes are moist.     Pharynx: Posterior oropharyngeal erythema present. No pharyngeal swelling.     Tonsils: No tonsillar exudate. 1+ on the right. 1+ on the left.  Eyes:     Conjunctiva/sclera: Conjunctivae normal.     Pupils: Pupils are equal, round, and reactive to light.  Cardiovascular:     Rate and Rhythm: Normal rate and regular rhythm.  Pulmonary:     Effort: Pulmonary effort is normal. No respiratory distress.     Breath sounds: Normal breath sounds. No wheezing or rales.  Abdominal:     General: There is no distension.     Palpations: Abdomen is soft.  Musculoskeletal:        General: Normal range of motion.     Cervical back: Normal range of motion.   Lymphadenopathy:     Cervical: Cervical adenopathy present.  Skin:    General: Skin is warm and dry.  Neurological:     Mental Status: She is alert.      UC Treatments / Results  Labs (all labs ordered are listed, but only abnormal results are displayed) Labs Reviewed  POCT RAPID STREP A (OFFICE) - Abnormal; Notable for the following components:      Result Value   Rapid Strep A Screen Positive (*)    All other components within normal limits  POC SARS CORONAVIRUS 2 AG -  ED  POCT INFLUENZA A/B    EKG   Radiology No results found.  Procedures Procedures (including critical Black time)  Medications Ordered in UC Medications - No data to display  Initial Impression / Assessment and Plan / UC Course  I have reviewed the triage vital signs and the nursing notes.  Pertinent labs & imaging results that were available during my Black of the patient were reviewed by me and considered in my medical decision making (see chart for details).     Strep test is positive.  Discussed strep with patient.  Impressed upon her the importance of 10 full days of antibiotics.  Discussed control of symptoms  COVID and flu tests are negative Final Clinical Impressions(s) / UC Diagnoses   Final diagnoses:  Strep throat     Discharge Instructions      You must take the antibiotic cephalexin 2 times a day for 10 full days.  Do not stop early You may use salt water gargles, Chloraseptic spray or lozenges for pain You may take Tylenol or ibuprofen for pain and fever Call for problems      ED Prescriptions     Medication Sig Dispense Auth. Provider   cephALEXin (KEFLEX) 500 MG capsule Take 1 capsule (500 mg total) by mouth 2 (two) times daily. 20 capsule Eustace Moore, MD      PDMP not reviewed this encounter.   Eustace Moore, MD 12/15/22 [phone]

## 2022-12-15 NOTE — Discharge Instructions (Addendum)
You must take the antibiotic cephalexin 2 times a day for 10 full days.  Do not stop early You may use salt water gargles, Chloraseptic spray or lozenges for pain You may take Tylenol or ibuprofen for pain and fever Call for problems

## 2022-12-22 NOTE — Telephone Encounter (Signed)
Called patient, LVM to call office to schedule for labs for refills, thanks.

## 2023-01-11 ENCOUNTER — Other Ambulatory Visit: Payer: Self-pay | Admitting: Family Medicine

## 2023-01-11 DIAGNOSIS — E039 Hypothyroidism, unspecified: Secondary | ICD-10-CM

## 2023-01-11 NOTE — Telephone Encounter (Signed)
Please call pt she is OVERDUE FOR OV AND LABS FOR THYROID.  Thanks.

## 2023-01-11 NOTE — Telephone Encounter (Signed)
Called patient left vm to call back to schedule an OV and labs for thyroid

## 2023-01-30 ENCOUNTER — Telehealth: Payer: Self-pay | Admitting: Family Medicine

## 2023-01-30 DIAGNOSIS — E559 Vitamin D deficiency, unspecified: Secondary | ICD-10-CM

## 2023-01-30 DIAGNOSIS — E039 Hypothyroidism, unspecified: Secondary | ICD-10-CM

## 2023-01-30 DIAGNOSIS — Z0000 Encounter for general adult medical examination without abnormal findings: Secondary | ICD-10-CM

## 2023-01-30 NOTE — Telephone Encounter (Signed)
Orders placed.

## 2023-01-30 NOTE — Telephone Encounter (Signed)
Patient request labs prior to CPE. Please order. 

## 2023-02-02 LAB — CBC WITH DIFFERENTIAL/PLATELET
Absolute Monocytes: 429 cells/uL (ref 200–950)
Basophils Absolute: 28 cells/uL (ref 0–200)
Basophils Relative: 0.5 %
Eosinophils Absolute: 50 cells/uL (ref 15–500)
Eosinophils Relative: 0.9 %
HCT: 40.8 % (ref 35.0–45.0)
Hemoglobin: 13.6 g/dL (ref 11.7–15.5)
Lymphs Abs: 1766 cells/uL (ref 850–3900)
MCH: 28.3 pg (ref 27.0–33.0)
MCHC: 33.3 g/dL (ref 32.0–36.0)
MCV: 85 fL (ref 80.0–100.0)
MPV: 11.2 fL (ref 7.5–12.5)
Monocytes Relative: 7.8 %
Neutro Abs: 3229 cells/uL (ref 1500–7800)
Neutrophils Relative %: 58.7 %
Platelets: 209 10*3/uL (ref 140–400)
RBC: 4.8 10*6/uL (ref 3.80–5.10)
RDW: 13.9 % (ref 11.0–15.0)
Total Lymphocyte: 32.1 %
WBC: 5.5 10*3/uL (ref 3.8–10.8)

## 2023-02-02 LAB — TSH: TSH: 2.28 mIU/L

## 2023-02-02 LAB — LIPID PANEL W/REFLEX DIRECT LDL
Cholesterol: 158 mg/dL (ref <200)
HDL: 68 mg/dL (ref >=50)
LDL Cholesterol (Calc): 75 mg/dL (calc)
Non-HDL Cholesterol (Calc): 90 mg/dL (calc) (ref <130)
Total CHOL/HDL Ratio: 2.3 (calc) (ref <5.0)
Triglycerides: 69 mg/dL (ref <150)

## 2023-02-02 LAB — COMPLETE METABOLIC PANEL WITH GFR
AG Ratio: 1.9 (calc) (ref 1.0–2.5)
ALT: 10 U/L (ref 6–29)
AST: 13 U/L (ref 10–30)
Albumin: 4.6 g/dL (ref 3.6–5.1)
Alkaline phosphatase (APISO): 66 U/L (ref 31–125)
BUN: 10 mg/dL (ref 7–25)
CO2: 24 mmol/L (ref 20–32)
Calcium: 9.3 mg/dL (ref 8.6–10.2)
Chloride: 104 mmol/L (ref 98–110)
Creat: 0.93 mg/dL (ref 0.50–0.97)
Globulin: 2.4 g/dL (calc) (ref 1.9–3.7)
Glucose, Bld: 85 mg/dL (ref 65–99)
Potassium: 4.2 mmol/L (ref 3.5–5.3)
Sodium: 139 mmol/L (ref 135–146)
Total Bilirubin: 0.5 mg/dL (ref 0.2–1.2)
Total Protein: 7 g/dL (ref 6.1–8.1)
eGFR: 84 mL/min/{1.73_m2} (ref >=60)

## 2023-02-02 LAB — FERRITIN: Ferritin: 26 ng/mL (ref 16–154)

## 2023-02-02 LAB — B12 AND FOLATE PANEL
Folate: 20.5 ng/mL
Vitamin B-12: 407 pg/mL (ref 200–1100)

## 2023-02-02 LAB — VITAMIN D 25 HYDROXY (VIT D DEFICIENCY, FRACTURES): Vit D, 25-Hydroxy: 33 ng/mL (ref 30–100)

## 2023-02-03 ENCOUNTER — Other Ambulatory Visit: Payer: Self-pay | Admitting: Physician Assistant

## 2023-02-03 DIAGNOSIS — E039 Hypothyroidism, unspecified: Secondary | ICD-10-CM

## 2023-02-03 MED ORDER — LEVOTHYROXINE SODIUM 100 MCG PO TABS: 100.0000 ug | ORAL_TABLET | Freq: Every day | ORAL | 3 refills | Status: DC

## 2023-02-03 NOTE — Progress Notes (Signed)
Kitt,   Thyroid looks good. Sent refills.  Cholesterol looks great.  Vitamin D improved but still low normal range.  How much are you taking daily?  B12 looks good.  Iron stores down a little. Increase iron rich foods.

## 2023-02-06 ENCOUNTER — Encounter: Payer: Self-pay | Admitting: Family Medicine

## 2023-02-06 ENCOUNTER — Ambulatory Visit (INDEPENDENT_AMBULATORY_CARE_PROVIDER_SITE_OTHER): Payer: BC Managed Care – PPO | Admitting: Family Medicine

## 2023-02-06 ENCOUNTER — Ambulatory Visit (INDEPENDENT_AMBULATORY_CARE_PROVIDER_SITE_OTHER): Payer: BC Managed Care – PPO

## 2023-02-06 VITALS — BP 109/81 | HR 80 | Resp 18 | Ht 68.0 in | Wt 136.0 lb

## 2023-02-06 DIAGNOSIS — R079 Chest pain, unspecified: Secondary | ICD-10-CM | POA: Insufficient documentation

## 2023-02-06 NOTE — Assessment & Plan Note (Addendum)
-   EKG NSR HR 68bpm, no st elevations, no pvcs/pacs, no afib.  - cholesterol normal and well controlled - dad has hx of CAD - on physical exam patient has tenderness to palpation of chest wall likely msk  - pt says that she had "shocking news" delivered to her and believes her heart underwent an extreme amount of stress and has heart damage. We discuss that with her age, good cholesterol, normal blood pressure she has low risk for age and medical history for heart disease. We will go ahead and get a cxr and refer to cardiology for further assessment

## 2023-02-06 NOTE — Progress Notes (Signed)
Acute Office Visit  Subjective:     Patient ID: Kristina Black, female    DOB: [birth year], 32 y.o.   MRN: [id number]  Chief Complaint  Patient presents with   Chest Pain    HPI Patient is in today for chest pain. She has been having chest pain for months. Since the end of June 2023 until now she feels like she has been more sick. She is a Runner, broadcasting/film/video. She has been treated in Urgent Care multiple times this year. She received shocking news and didn't sleep for 44 hours. She was able to sleep this week and now on February 04, 2023 she was at [employer] and when she took a deep breath and felt like something was going to pop inside her heart. At this time it lasted several seconds. She was sitting down and doing work. She says her body has been in a state of stress for some time. Denies any changes in activity. She did have her TSH checked 5 days ago and it was normal.   Family hx is unknown. Dad has history of heart disease.  Review of Systems  Constitutional:  Negative for chills and fever.  Respiratory:  Negative for cough and shortness of breath.   Cardiovascular:  Positive for chest pain.  Neurological:  Negative for headaches.        Objective:    BP 109/81 (BP Location: Left Arm, Patient Position: Sitting, Cuff Size: Normal)   Pulse 80   Resp 18   Ht 5\' 8"  (1.727 m)   Wt 136 lb (61.7 kg)   SpO2 99%   BMI 20.68 kg/m    Physical Exam Vitals and nursing note reviewed.  Constitutional:      General: She is not in acute distress.    Appearance: Normal appearance.  HENT:     Head: Normocephalic and atraumatic.     Right Ear: External ear normal.     Left Ear: External ear normal.     Nose: Nose normal.  Eyes:     Conjunctiva/sclera: Conjunctivae normal.  Cardiovascular:     Rate and Rhythm: Normal rate and regular rhythm.     Comments: Tenderness to palpation of chest wall Pulmonary:     Effort: Pulmonary effort is normal.     Breath sounds: Normal breath sounds.   Neurological:     General: No focal deficit present.     Mental Status: She is alert and oriented to person, place, and time.  Psychiatric:        Mood and Affect: Mood normal.        Behavior: Behavior normal.        Thought Content: Thought content normal.        Judgment: Judgment normal.     No results found for any visits on 02/06/23.      Assessment & Plan:   Problem List Items Addressed This Visit       Other   Chest pain - Primary    - EKG NSR HR 68bpm, no st elevations, no pvcs/pacs, no afib.  - cholesterol normal and well controlled - dad has hx of CAD - on physical exam patient has tenderness to palpation of chest wall likely msk  - pt says that she had "shocking news" delivered to her and believes her heart underwent an extreme amount of stress and has heart damage. We discuss that with her age, good cholesterol, normal blood pressure she has low risk for age and  medical history for heart disease. We will go ahead and get a cxr and refer to cardiology for further assessment       Relevant Orders   DG Chest 2 View   Ambulatory referral to Cardiology   EKG 12-Lead    No orders of the defined types were placed in this encounter.   Return if symptoms worsen or fail to improve, for with PCP.  Charlton Amor, DO

## 2023-02-17 ENCOUNTER — Ambulatory Visit
Admission: EM | Admit: 2023-02-17 | Discharge: 2023-02-17 | Disposition: A | Discharge status: Home / Self Care | Payer: BC Managed Care – PPO | Source: Home / Self Care

## 2023-02-17 DIAGNOSIS — R197 Diarrhea, unspecified: Secondary | ICD-10-CM

## 2023-02-17 DIAGNOSIS — J069 Acute upper respiratory infection, unspecified: Secondary | ICD-10-CM

## 2023-02-17 LAB — RESPIRATORY PANEL BY PCR

## 2023-02-17 LAB — POCT RAPID STREP A (OFFICE): Rapid Strep A Screen: NEGATIVE

## 2023-02-17 LAB — POCT INFLUENZA A/B
Influenza A, POC: NEGATIVE
Influenza B, POC: NEGATIVE

## 2023-02-17 LAB — POC SARS CORONAVIRUS 2 AG -  ED: SARS Coronavirus 2 Ag: NEGATIVE

## 2023-02-17 MED ORDER — FLUTICASONE PROPIONATE 50 MCG/ACT NA SUSP: 1.0000 | Freq: Every day | NASAL | 0 refills | Status: DC

## 2023-02-17 MED ORDER — BENZONATATE 100 MG PO CAPS: 100.0000 mg | ORAL_CAPSULE | Freq: Three times a day (TID) | ORAL | 0 refills | Status: DC

## 2023-02-17 NOTE — ED Provider Notes (Signed)
[facility]    CSN: [id number] Arrival date & time: 02/17/23  0801      History   Chief Complaint Chief Complaint  Patient presents with   URI   Nausea   Diarrhea    HPI Kristina Black is a 32 y.o. female.   32 year old female presents today due to concerns of upper respiratory symptoms in addition to nausea and diarrhea.  She states she started with cold-like symptoms on Tuesday.  Does have some postnasal drainage, rhinorrhea, sore throat, and a phlegmy cough.  She states her chest feels "wet".  Took her temperature last evening and it was 99.1, but patient states she felt like it was over 100 as she was "burning up".  Does have a history of strep 2 months ago.  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  Patient states yesterday she had 2 bouts of diarrhea and also had 2 bouts of diarrhea this morning.  She denies abdominal pain.  No vomiting. Was exposed to pneumonia, would like to rule that out. She denies wheezing, SOB or DOE. Was seen for CP on 02/06/23, states that sx is still present but no worse than before. Has not taken any OTC meds other than Thyme tea and sauteing jalapenos to help with her nasal congestion.    URI Presenting symptoms: congestion, cough, fever (subjective), rhinorrhea and sore throat   Associated symptoms: no headaches, no myalgias and no wheezing   Diarrhea Associated symptoms: fever (subjective) and URI   Associated symptoms: no headaches and no myalgias     Past Medical History:  Diagnosis Date   Allergy    Anxiety    Thyroid disease     Patient Active Problem List   Diagnosis Date Noted   Chest pain 02/06/2023   Mild mood disorder (HCC) 05/28/2018   Anxiety 05/28/2018   Panic disorder 04/10/2017   Right foot injury 07/15/2016   Keratosis pilaris 12/16/2014   Hypothyroid 08/10/2012   GAD (generalized anxiety disorder) 01/03/2011   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 No pertinent surgical history.  OB History   No obstetric history on file.      Home Medications    Prior to Admission medications   Medication Sig Start Date End Date Taking? Authorizing Provider  benzonatate (TESSALON) 100 MG capsule Take 1 capsule (100 mg total) by mouth every 8 (eight) hours. 02/17/23  Yes Shaelynn Dragos L, PA  fluticasone (FLONASE) 50 MCG/ACT nasal spray Place 1 spray into both nostrils daily. 02/17/23  Yes Jatavis Malek L, PA  levothyroxine (SYNTHROID) 100 MCG tablet Take 1 tablet (100 mcg total) by mouth daily. 02/03/23   Jomarie Longs, PA-C    Family History Family History  Problem Relation Age of Onset   Hypothyroidism Father    Parkinson's disease Maternal Grandfather    Diabetes type II Paternal Grandmother    Hypothyroidism Maternal Grandmother     Social History Social History   Tobacco Use   Smoking status: Never   Smokeless tobacco: Never  Substance Use Topics   Alcohol use: No   Drug use: No     Allergies   Lamotrigine, Mirtazapine, Paliperidone er, Aripiprazole, and Penicillins   Review of Systems Review of Systems  Constitutional:  Positive for fever (subjective).  HENT:  Positive for congestion, rhinorrhea and sore throat.   Respiratory:  Positive for cough. Negative for shortness of  breath and wheezing.   Cardiovascular:  Positive for chest pain.  Gastrointestinal:  Positive for diarrhea.  Musculoskeletal:  Negative for myalgias.  Neurological:  Negative for dizziness and headaches.  Pertinent positives listed in HPI   Physical Exam Triage Vital Signs ED Triage Vitals  Enc Vitals Group     BP 02/17/23 0809 130/84     Pulse Rate 02/17/23 0809 97     Resp 02/17/23 0809 16     Temp 02/17/23 0809 98.2 F (36.8 C)     Temp Source 02/17/23 0809 Oral     SpO2 02/17/23 0809 98 %     Weight --      Height --      Head Circumference --      Peak Flow --      Pain  Score 02/17/23 0808 6     Pain Loc --      Pain Edu? --      Excl. in GC? --    No data found.  Updated Vital Signs BP 130/84   Pulse 97   Temp 98.2 F (36.8 C) (Oral)   Resp 16   LMP 01/21/2023   SpO2 98%   Visual Acuity Right Eye Distance:   Left Eye Distance:   Bilateral Distance:    Right Eye Near:   Left Eye Near:    Bilateral Near:     Physical Exam Vitals and nursing note reviewed.  Constitutional:      General: She is not in acute distress.    Appearance: Normal appearance. She is well-developed. She is not ill-appearing, toxic-appearing or diaphoretic.  HENT:     Head: Normocephalic and atraumatic.     Right Ear: Tympanic membrane, ear canal and external ear normal. There is no impacted cerumen.     Left Ear: Tympanic membrane, ear canal and external ear normal. There is no impacted cerumen.     Nose: Congestion and rhinorrhea present.     Mouth/Throat:     Mouth: Mucous membranes are moist.     Pharynx: Oropharynx is clear. Posterior oropharyngeal erythema (posterior pharynx only; lymphoid hyperplasia) present.  Eyes:     Conjunctiva/sclera: Conjunctivae normal.  Cardiovascular:     Rate and Rhythm: Normal rate and regular rhythm.     Heart sounds: No murmur heard. Pulmonary:     Effort: Pulmonary effort is normal. No respiratory distress.     Breath sounds: Normal breath sounds.  Abdominal:     Palpations: Abdomen is soft.     Tenderness: There is no abdominal tenderness.  Musculoskeletal:        General: No swelling.     Cervical back: Neck supple.  Skin:    General: Skin is warm and dry.     Capillary Refill: Capillary refill takes less than 2 seconds.     Coloration: Skin is not jaundiced.     Findings: No bruising, erythema or rash.  Neurological:     Mental Status: She is alert.  Psychiatric:        Mood and Affect: Mood normal.      UC Treatments / Results  Labs (all labs ordered are listed, but only abnormal results are  displayed) Labs Reviewed  RESPIRATORY PANEL BY PCR  POCT RAPID STREP A (OFFICE)  POCT INFLUENZA A/B  POC SARS CORONAVIRUS 2 AG -  ED    EKG   Radiology No results found.  Procedures Procedures (including critical care time)  Medications Ordered in UC Medications -  No data to display  Initial Impression / Assessment and Plan / UC Course  I have reviewed the triage vital signs and the nursing notes.  Pertinent labs & imaging results that were available during my care of the patient were reviewed by me and considered in my medical decision making (see chart for details).     Viral URI with cough - VSS. Mild erythema to posterior pharynx with lymphoid hyperplasia favors viral vs allergic cause. Pt has not tried any OTC Meds, sx present x2 days. Will recommend flonase and tessalon. No indication for repeat CXR given minimal cough, normal vitals and no adventitious breath sounds on exam. Suspect adenovirus, pt is requesting 20 panel respiratory test. Diarrhea - x only a few episodes. No red flag s/sx, pt appears hydrated. No guarding on exam. Supportive measures only, bland foods, hydration.   Final Clinical Impressions(s) / UC Diagnoses   Final diagnoses:  Viral URI with cough  Diarrhea, unspecified type     Discharge Instructions      Your symptoms sound most consistent with a viral illness, likely adenovirus, which causes upper respiratory symptoms in addition to diarrhea. Supportive care is the mainstay of treatment - rest, hydration, frequent hand washing, tylenol or ibuprofen as needed for fever or aches.  We do not recommend OTC anti-diarrheal medications. Instead, choose food items off the list to help. Stay hydrated with water, gatorade, pedialyte. For your cough, I have called in a medication to help.  I have also called in a nasal spray to help with your congestion and sore throat. You may return to work in 24 hours. Please wear a mask if coughing and wash hands  frequently.      ED Prescriptions     Medication Sig Dispense Auth. Provider   fluticasone (FLONASE) 50 MCG/ACT nasal spray Place 1 spray into both nostrils daily. 16 mL Devinn Voshell L, PA   benzonatate (TESSALON) 100 MG capsule Take 1 capsule (100 mg total) by mouth every 8 (eight) hours. 21 capsule Thi Sisemore L, PA      PDMP not reviewed this encounter.   Maretta Bees, [place] 02/17/23 1929

## 2023-02-17 NOTE — Discharge Instructions (Addendum)
Your symptoms sound most consistent with a viral illness, likely adenovirus, which causes upper respiratory symptoms in addition to diarrhea. Supportive care is the mainstay of treatment - rest, hydration, frequent hand washing, tylenol or ibuprofen as needed for fever or aches.  We do not recommend OTC anti-diarrheal medications. Instead, choose food items off the list to help. Stay hydrated with water, gatorade, pedialyte. For your cough, I have called in a medication to help.  I have also called in a nasal spray to help with your congestion and sore throat. You may return to work in 24 hours. Please wear a mask if coughing and wash hands frequently.

## 2023-02-17 NOTE — ED Triage Notes (Signed)
Pt presents to uc with co of fevers since Tuesday loose stools , nausea, runny nose cough. 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 No otc meds.

## 2023-02-19 ENCOUNTER — Ambulatory Visit
Admission: EM | Admit: 2023-02-19 | Discharge: 2023-02-19 | Disposition: A | Discharge status: Home / Self Care | Payer: BC Managed Care – PPO

## 2023-02-19 ENCOUNTER — Encounter: Payer: Self-pay | Admitting: Emergency Medicine

## 2023-02-19 DIAGNOSIS — J208 Acute bronchitis due to other specified organisms: Secondary | ICD-10-CM

## 2023-02-19 NOTE — ED Triage Notes (Signed)
Patient was seen on Friday 02/17/2023, COVID, FLU and Strep tests were done all negative.  Patient requested a 20-viral respiratory panel to be done, which were all negative.  Patient is now c/o "wet cough w/mucous and still feeling fatigue.  Patient requesting a CVR to r/o pneumonia.  Currently taken all meds prescribed on Friday.

## 2023-02-19 NOTE — ED Provider Notes (Addendum)
[facility]    CSN: [id number] Arrival date & time: 02/19/23  1415      History   Chief Complaint Chief Complaint  Patient presents with   Cough    HPI Kristina Black is a 32 y.o. female.   HPI  Patient called.  She is here because she feels like she might need a chest x-ray.  She has already had a respiratory viral panel, COVID, flu, and strep testing.  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  She is worried about giving them her infection.  I tried to explain to her that she probably got the infection from them.  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 No pertinent surgical history.  OB History   No obstetric history on file.      Home Medications    Prior to Admission medications   Medication Sig Start Date End Date Taking? Authorizing Provider  benzonatate (TESSALON) 100 MG capsule Take 1 capsule (100 mg total) by mouth every 8 (eight) hours. 02/17/23  Yes Crain, Whitney L, PA  fluticasone (FLONASE) 50 MCG/ACT nasal spray Place 1 spray into both nostrils daily. 02/17/23  Yes Crain, Whitney L, PA  levothyroxine (SYNTHROID) 100 MCG tablet Take 1 tablet (100 mcg total) by mouth daily. 02/03/23  Yes Breeback, Lonna Cobb, PA-C    Family History Family History  Problem Relation Age of Onset   Hypothyroidism Father    Parkinson's disease Maternal Grandfather    Diabetes type II Paternal Grandmother     Hypothyroidism Maternal Grandmother     Social History Social History   Tobacco Use   Smoking status: Never   Smokeless tobacco: Never  Substance Use Topics   Alcohol use: No   Drug use: No     Allergies   Lamotrigine, Mirtazapine, Paliperidone er, Aripiprazole, and Penicillins   Review of Systems Review of Systems See HPI  Physical Exam Triage Vital Signs ED Triage Vitals  Enc Vitals Group     BP 02/19/23 1501 102/68     Pulse Rate 02/19/23 1501 83     Resp 02/19/23 1501 18     Temp 02/19/23 1501 98.6 F (37 C)     Temp Source 02/19/23 1501 Oral     SpO2 02/19/23 1501 100 %     Weight 02/19/23 1502 136 lb 0.4 oz (61.7 kg)     Height 02/19/23 1502 5\' 8"  (1.727 m)     Head Circumference --      Peak Flow --      Pain Score 02/19/23 1502 0     Pain Loc --      Pain Edu? --      Excl. in GC? --    No data found.  Updated Vital Signs BP 102/68 (BP Location: Right Arm)   Pulse 83   Temp 98.6 F (  37 C) (Oral)   Resp 18   Ht 5\' 8"  (1.727 m)   Wt 61.7 kg   LMP 01/21/2023   SpO2 100%   BMI 20.68 kg/m      Physical Exam Constitutional:      General: She is not in acute distress.    Appearance: Normal appearance. She is well-developed. She is not ill-appearing.  HENT:     Head: Normocephalic and atraumatic.     Right Ear: Tympanic membrane and ear canal normal.     Left Ear: Tympanic membrane and ear canal normal.     Nose: Congestion present.     Mouth/Throat:     Pharynx: No posterior oropharyngeal erythema.  Eyes:     Conjunctiva/sclera: Conjunctivae normal.     Pupils: Pupils are equal, round, and reactive to light.  Cardiovascular:     Rate and Rhythm: Normal rate and regular rhythm.     Heart sounds: Normal heart sounds.  Pulmonary:     Effort: Pulmonary effort is normal. No respiratory distress.     Breath sounds: Normal breath sounds.  Abdominal:     General: There is no distension.     Palpations: Abdomen is soft.  Musculoskeletal:         General: Normal range of motion.     Cervical back: Normal range of motion.  Lymphadenopathy:     Cervical: Cervical adenopathy present.  Skin:    General: Skin is warm and dry.  Neurological:     Mental Status: She is alert.      UC Treatments / Results  Labs (all labs ordered are listed, but only abnormal results are displayed) Labs Reviewed - No data to display  EKG   Radiology No results found.  Procedures Procedures (including critical care time)  Medications Ordered in UC Medications - No data to display  Initial Impression / Assessment and Plan / UC Course  I have reviewed the triage vital signs and the nursing notes.  Pertinent labs & imaging results that were available during my care of the patient were reviewed by me and considered in my medical decision making (see chart for details).     Discussed viral bronchitis.  Discussed that she may cough for 4 to 6 weeks.  Discussed that this will not get better with antibiotic treatment. Final Clinical Impressions(s) / UC Diagnoses   Final diagnoses:  Acute viral bronchitis     Discharge Instructions      Drink lots of water Call for problems   ED Prescriptions   None    PDMP not reviewed this encounter.   Eustace Moore, MD 02/19/23 1553    Eustace Moore, MD 03/22/23 [phone]

## 2023-02-19 NOTE — Discharge Instructions (Signed)
Drink lots of water Call for problems

## 2023-02-22 ENCOUNTER — Telehealth: Payer: Self-pay | Admitting: Family Medicine

## 2023-02-22 MED ORDER — BENZONATATE 200 MG PO CAPS: 200.0000 mg | ORAL_CAPSULE | Freq: Three times a day (TID) | ORAL | 0 refills | Status: DC | PRN

## 2023-02-22 NOTE — Telephone Encounter (Signed)
Requests refill of cough medication

## 2023-03-01 ENCOUNTER — Ambulatory Visit
Admission: EM | Admit: 2023-03-01 | Discharge: 2023-03-01 | Disposition: A | Discharge status: Home / Self Care | Payer: BC Managed Care – PPO | Attending: Family Medicine | Admitting: Family Medicine

## 2023-03-01 ENCOUNTER — Ambulatory Visit (INDEPENDENT_AMBULATORY_CARE_PROVIDER_SITE_OTHER): Payer: BC Managed Care – PPO

## 2023-03-01 ENCOUNTER — Encounter: Payer: Self-pay | Admitting: Emergency Medicine

## 2023-03-01 DIAGNOSIS — R059 Cough, unspecified: Secondary | ICD-10-CM

## 2023-03-01 NOTE — ED Provider Notes (Signed)
[facility]    CSN: [id number] Arrival date & time: 03/01/23  1925      History   Chief Complaint Chief Complaint  Patient presents with   Cough    HPI Kristina Black is a 32 y.o. female.   HPI Pleasant 33 year old female presents with cough for 1 week.  Reports that she was diagnosed with acute viral bronchitis a week ago and still gets winded somewhat when speaking.  PMH significant for anxiety and hypothyroidism.  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 No pertinent surgical history.  OB History   No obstetric history on file.      Home Medications    Prior to Admission medications   Medication Sig Start Date End Date Taking? Authorizing Provider  fluticasone (FLONASE) 50 MCG/ACT nasal spray Place 1 spray into both nostrils daily. 02/17/23  Yes Crain, Whitney L, PA  levothyroxine (SYNTHROID) 100 MCG tablet Take 1 tablet (100 mcg total) by mouth daily. 02/03/23  Yes Breeback, Jade L, PA-C  benzonatate (TESSALON) 100 MG capsule Take 1 capsule (100 mg total) by mouth every 8 (eight) hours. 02/17/23   Crain, Whitney L, PA  benzonatate (TESSALON) 200 MG capsule Take 1 capsule (200 mg total) by mouth 3 (three) times daily as needed for cough. 02/22/23   Eustace Moore, MD    Family History Family History  Problem Relation Age of Onset   Hypothyroidism Father    Parkinson's disease Maternal Grandfather    Diabetes type II Paternal Grandmother    Hypothyroidism Maternal Grandmother     Social History Social  History   Tobacco Use   Smoking status: Never   Smokeless tobacco: Never  Substance Use Topics   Alcohol use: No   Drug use: No     Allergies   Lamotrigine, Mirtazapine, Paliperidone er, Aripiprazole, and Penicillins   Review of Systems Review of Systems  All other systems reviewed and are negative.    Physical Exam Triage Vital Signs ED Triage Vitals [03/01/23 1941]  Encounter Vitals Group     BP      Systolic BP Percentile      Diastolic BP Percentile      Pulse      Resp      Temp      Temp src      SpO2      Weight      Height      Head Circumference      Peak Flow      Pain Score 0     Pain Loc      Pain Education      Exclude from Growth Chart    No data found.  Updated Vital Signs BP 100/66 (BP Location: Left Arm)   Pulse 82   Temp 98.2 F (36.8 C) (Oral)   Resp 16   LMP 01/21/2023   SpO2 98%    Physical Exam Vitals and nursing note reviewed.  Constitutional:      General: She  is not in acute distress.    Appearance: Normal appearance. She is normal weight. She is not ill-appearing.  HENT:     Head: Normocephalic and atraumatic.     Right Ear: Tympanic membrane, ear canal and external ear normal.     Left Ear: Tympanic membrane, ear canal and external ear normal.     Mouth/Throat:     Mouth: Mucous membranes are moist.     Pharynx: Oropharynx is clear.  Eyes:     Extraocular Movements: Extraocular movements intact.     Conjunctiva/sclera: Conjunctivae normal.     Pupils: Pupils are equal, round, and reactive to light.  Cardiovascular:     Rate and Rhythm: Normal rate and regular rhythm.     Pulses: Normal pulses.     Heart sounds: Normal heart sounds.  Pulmonary:     Effort: Pulmonary effort is normal.     Breath sounds: Normal breath sounds. No wheezing, rhonchi or rales.  Musculoskeletal:        General: Normal range of motion.     Cervical back: Normal range of motion and neck supple.  Skin:    General: Skin is warm and dry.   Neurological:     General: No focal deficit present.     Mental Status: She is alert and oriented to person, place, and time. Mental status is at baseline.  Psychiatric:        Mood and Affect: Mood normal.        Behavior: Behavior normal.        Thought Content: Thought content normal.      UC Treatments / Results  Labs (all labs ordered are listed, but only abnormal results are displayed) Labs Reviewed - No data to display  EKG   Radiology DG Chest 2 View  Result Date: 03/01/2023 CLINICAL DATA:  cough x 1 week EXAM: CHEST - 2 VIEW COMPARISON:  Chest x-ray 02/06/2023 FINDINGS: The heart and mediastinal contours are within normal limits. No focal consolidation. No pulmonary edema. No pleural effusion. No pneumothorax. No acute osseous abnormality. IMPRESSION: No active cardiopulmonary disease. Electronically Signed   By: Tish Frederickson M.D.   On: 03/01/2023 19:55    Procedures Procedures (including critical care time)  Medications Ordered in UC Medications - No data to display  Initial Impression / Assessment and Plan / UC Course  I have reviewed the triage vital signs and the nursing notes.  Pertinent labs & imaging results that were available during my care of the patient were reviewed by me and considered in my medical decision making (see chart for details).     MDM: 1.  Cough, unspecified type-CXR revealed above. Advised patient may take previously prescribed Tessalon Perles as needed for cough.  Encouraged increase daily water intake to 64 ounces per day while taking this medication.  Advised if symptoms worsen and/or unresolved please follow-up with PCP or here for further evaluation.  Patient discharged home, hemodynamically stable. Final Clinical Impressions(s) / UC Diagnoses   Final diagnoses:  Cough, unspecified type     Discharge Instructions      Advised patient may take previously prescribed Tessalon Perles as needed for cough.  Encouraged increase  daily water intake to 64 ounces per day while taking this medication.  Advised if symptoms worsen and/or unresolved please follow-up with PCP or here for further evaluation.     ED Prescriptions   None    PDMP not reviewed this encounter.   Trevor Iha, FNP 03/01/23  2021  

## 2023-03-01 NOTE — ED Triage Notes (Signed)
Patient presents to Urgent Care with complaints of coughing since 1 week ago. Patient reports being diagnosed with bronchitis a week ago. Taking Tessalon pearls 200 mg for cough. Taking Flonase daily. Patient states she is improving but still feeling winded with speaking. Still having coughing at night.

## 2023-03-01 NOTE — Discharge Instructions (Addendum)
Advised patient may take previously prescribed Tessalon Perles as needed for cough.  Encouraged increase daily water intake to 64 ounces per day while taking this medication.  Advised if symptoms worsen and/or unresolved please follow-up with PCP or here for further evaluation.

## 2023-03-20 ENCOUNTER — Ambulatory Visit (INDEPENDENT_AMBULATORY_CARE_PROVIDER_SITE_OTHER): Payer: Self-pay | Admitting: Family Medicine

## 2023-03-20 ENCOUNTER — Other Ambulatory Visit (HOSPITAL_COMMUNITY)
Admission: RE | Admit: 2023-03-20 | Discharge: 2023-03-20 | Disposition: A | Discharge status: Home / Self Care | Payer: Medicaid Other | Source: Ambulatory Visit | Attending: Family Medicine | Admitting: Family Medicine

## 2023-03-20 ENCOUNTER — Encounter: Payer: Self-pay | Admitting: Family Medicine

## 2023-03-20 VITALS — BP 122/64 | HR 103 | Ht 68.0 in | Wt 131.0 lb

## 2023-03-20 DIAGNOSIS — Z124 Encounter for screening for malignant neoplasm of cervix: Secondary | ICD-10-CM

## 2023-03-20 DIAGNOSIS — R052 Subacute cough: Secondary | ICD-10-CM

## 2023-03-20 DIAGNOSIS — Z01419 Encounter for gynecological examination (general) (routine) without abnormal findings: Secondary | ICD-10-CM | POA: Insufficient documentation

## 2023-03-20 DIAGNOSIS — Z0000 Encounter for general adult medical examination without abnormal findings: Secondary | ICD-10-CM | POA: Insufficient documentation

## 2023-03-20 DIAGNOSIS — Z1151 Encounter for screening for human papillomavirus (HPV): Secondary | ICD-10-CM | POA: Diagnosis not present

## 2023-03-20 MED ORDER — BENZONATATE 200 MG PO CAPS: 200.0000 mg | ORAL_CAPSULE | Freq: Three times a day (TID) | ORAL | 0 refills | Status: DC | PRN

## 2023-03-20 MED ORDER — PREDNISONE 20 MG PO TABS: 40.0000 mg | ORAL_TABLET | Freq: Every day | ORAL | 0 refills | Status: DC

## 2023-03-20 NOTE — Progress Notes (Signed)
Complete physical exam  Patient: Kristina Black   DOB: Jul 14, 1991   32 y.o. Female  MRN: [id number]  Subjective:    Chief Complaint  Patient presents with   Annual Exam    Kristina Black is a 32 y.o. female who presents today for a complete physical exam. She reports consuming a general diet. The patient does not participate in regular exercise at present. She generally feels  fairly well. Though has been sick a lot lately.   She does have additional problems to discuss today.   Had labs done in June for CPE. Show low iron and vit D.   She has unfortunately been sick on and off since the beginning of July.  She was initially diagnosed with a URI and then diagnosed with bronchitis.  She went back to urgent care about 2 weeks later for consistent cough.  She had a negative respiratory panel including negative for pertussis. Did she also had a chest x-ray she also had a negative chest x-ray performed.  She feels like she is still getting up yellow-brown mucus.  Recently resigned from her job but says she plans on going back in the spring.  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  And is just been getting sick back to back to back.  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  Most recent fall risk assessment:    02/06/2023   10:27 AM  Fall Risk   Falls in the past year? 0  Number falls in past yr: 0  Injury with Fall? 0  Risk for fall due to : No Fall Risks  Follow up Falls evaluation completed     Most recent depression screenings:    02/06/2023   10:28 AM 09/28/2021    3:59 PM  PHQ 2/9 Scores  PHQ - 2 Score 0 0        Patient Care Team: Agapito Games, MD as PCP - General (Family Medicine)   Outpatient Medications Prior to Visit  Medication Sig   levothyroxine (SYNTHROID) 100 MCG tablet Take 1 tablet (100 mcg total) by mouth daily.   [DISCONTINUED] benzonatate (TESSALON) 100 MG capsule Take 1 capsule (100 mg  total) by mouth every 8 (eight) hours.   [DISCONTINUED] benzonatate (TESSALON) 200 MG capsule Take 1 capsule (200 mg total) by mouth 3 (three) times daily as needed for cough.   [DISCONTINUED] fluticasone (FLONASE) 50 MCG/ACT nasal spray Place 1 spray into both nostrils daily.   No facility-administered medications prior to visit.    ROS        Objective:     BP 122/64   Pulse (!) 103   Ht 5\' 8"  (1.727 m)   Wt 131 lb (59.4 kg)   LMP 02/24/2023 (Exact Date)   SpO2 100%   BMI 19.92 kg/m    Physical Exam Vitals and nursing note reviewed.  Constitutional:      Appearance: She is well-developed.  HENT:     Head: Normocephalic and atraumatic.  Cardiovascular:     Rate and Rhythm: Normal rate and regular rhythm.     Heart sounds: Normal heart sounds.  Pulmonary:     Effort: Pulmonary effort is normal.     Breath sounds: Normal breath sounds.  Genitourinary:    General: Normal vulva.     Pubic Area: No rash.      Labia:        Right: No rash.  Left: No rash.      Vagina: Normal.     Cervix: Normal.     Uterus: Normal.      Adnexa: Right adnexa normal and left adnexa normal.  Skin:    General: Skin is warm and dry.  Neurological:     Mental Status: She is alert and oriented to person, place, and time.  Psychiatric:        Behavior: Behavior normal.      No results found for any visits on 03/20/23.     Assessment & Plan:    Routine Health Maintenance and Physical Exam  Immunization History  Administered Date(s) Administered   Influenza Inj Mdck Quad Pf 04/27/2022   Influenza Split 08/10/2012   Influenza,inj,Quad PF,6+ Mos 04/29/2013, 04/11/2014, 07/01/2015, 03/30/2017, 08/15/2019, 05/07/2020   Influenza-Unspecified 05/15/2021   PFIZER Comirnaty(Gray Top)Covid-19 Tri-Sucrose Vaccine 05/20/2022   PFIZER(Purple Top)SARS-COV-2 Vaccination 10/14/2019, 11/04/2019, 07/20/2020   PPD Test 07/01/2015, 08/12/2019   Pfizer Covid-19 Vaccine Bivalent Booster  30yrs & up 05/15/2021   Tdap 04/07/2008, 12/24/2016    Health Maintenance  Topic Date Due   Hepatitis C Screening  Never done   PAP SMEAR-Modifier  01/18/2020   COVID-19 Vaccine (6 - 2023-24 season) 07/15/2022   INFLUENZA VACCINE  03/16/2023   DTaP/Tdap/Td (3 - Td or Tdap) 12/25/2026   HIV Screening  Completed   Pneumococcal Vaccine 75-52 Years old  Aged Out   HPV VACCINES  Aged Out    Discussed health benefits of physical activity, and encouraged her to engage in regular exercise appropriate for her age and condition.  Problem List Items Addressed This Visit   None Visit Diagnoses     Wellness examination    -  Primary   Relevant Orders   Cytology - PAP   Subacute cough       Relevant Medications   predniSONE (DELTASONE) 20 MG tablet   benzonatate (TESSALON) 200 MG capsule   Screening for cervical cancer       Relevant Orders   Cytology - PAP     Keep up a regular exercise program and make sure you are eating a healthy diet Try to eat 4 servings of dairy a day, or if you are lactose intolerant take a calcium with vitamin D daily.  Your vaccines are up to date.  Pap performed. Will call with results in one week.   Struggling with persistent cough after 2 back-to-back illnesses.  Lungs are clear on exam today so discussed a trial of prednisone to see if this is helpful.  Call if not improving over the next couple of weeks.  Recent chest x-ray was normal which is reassuring.  So refill the [employer].  Return in about 1 year (around 03/19/2024) for Wellness Exam.    Nani Gasser, MD

## 2023-03-22 NOTE — Progress Notes (Signed)
Your Pap smear is normal. You are negative for HPV as well. Repeat pap smear in 5 years.

## 2023-03-27 ENCOUNTER — Other Ambulatory Visit: Payer: Self-pay | Admitting: Family Medicine

## 2023-03-27 DIAGNOSIS — R052 Subacute cough: Secondary | ICD-10-CM

## 2023-03-27 NOTE — Telephone Encounter (Signed)
Can you call patient and see if she actually is requesting a refill on not sure if she was just trying to send [place] some additional information that she developed a new system symptoms or if she really does want a refill.

## 2023-03-27 NOTE — Telephone Encounter (Signed)
Requesting  rx rfof prednisone 2omg  Last written 03/20/2023 Last OV 03/20/2023 Next schld 05/25/23. Patient comment: I have a new symptom as of yesterday, Sunday, 03/26/2023 .Marland Kitchen. sharp pain underneath my right breast when coughing that feels like I'm being stabbed between my ribs

## 2023-03-28 MED ORDER — PREDNISONE 20 MG PO TABS: 40.0000 mg | ORAL_TABLET | Freq: Every day | ORAL | 0 refills | Status: DC

## 2023-03-28 NOTE — Telephone Encounter (Signed)
Attempted call to patient. Left voice mail message requesting a return call.  

## 2023-03-29 NOTE — Telephone Encounter (Signed)
Again attempted call to patient. Left a voice mail message requesting a return call.

## 2023-03-31 NOTE — Telephone Encounter (Signed)
Left voice mail message that prescription requested had been sent to the pharmacy on home#

## 2023-04-03 ENCOUNTER — Encounter: Payer: Self-pay | Admitting: Family Medicine

## 2023-04-03 NOTE — Progress Notes (Signed)
Referring-Erika Tamera Punt, DO Reason for referral-chest pain  HPI: 32 year old female for evaluation of chest pain at request of Morey Hummingbird, DO.  Patient seen with chest pain June 2024.  Laboratories June 2024 showed hemoglobin 13.6, TSH 2.28, total cholesterol 158 with LDL 75, creatinine 0.93, potassium 4.2, normal liver functions.  Rib x-rays today August 2024 showed right ninth rib fx.  Pain felt likely to be musculoskeletal and cardiology asked to evaluate.  Patient states that over the past year she has had multiple upper respiratory infections.  Recently she had another episode and had some dyspnea associated with productive cough.  This is improving.  She has some dyspnea on exertion but no orthopnea, PND, pedal edema or syncope.  She also when she feels stressed feels a squeezing sensation in her chest transiently.  She does not have exertional chest pain.  Cardiology now asked to evaluate.  Current Outpatient Medications  Medication Sig Dispense Refill   benzonatate (TESSALON) 200 MG capsule Take 1 capsule (200 mg total) by mouth 3 (three) times daily as needed for cough. 30 capsule 0   levothyroxine (SYNTHROID) 100 MCG tablet Take 1 tablet (100 mcg total) by mouth daily. 90 tablet 3   No current facility-administered medications for this visit.    Allergies  Allergen Reactions   Lamotrigine Rash   Mirtazapine     Other reaction(s): Other Suicidal ideation, racing thoughts   Paliperidone Er     Other reaction(s): Dizziness, Hypotension   Aripiprazole Rash and Other (See Comments)    Muscle cramping, diarrhea, blurry vision,disorientation,heart racing,loss of dexterity in fingers/hands    Penicillins     As a baby. She is unaware of the reaction.      Past Medical History:  Diagnosis Date   Allergy    Anxiety    Thyroid disease     History reviewed. No pertinent surgical history.  Social History   Socioeconomic History   Marital status: Single    Spouse name:  Not on file   Number of children: Not on file   Years of education: Not on file   Highest education level: Not on file  Occupational History   Occupation: employed  Tobacco Use   Smoking status: Never   Smokeless tobacco: Never  Substance and Sexual Activity   Alcohol use: No   Drug use: No   Sexual activity: Yes  Other Topics Concern   Not on file  Social History Narrative   She does exercise.    Social Determinants of Health   Financial Resource Strain: Not on file  Food Insecurity: Not on file  Transportation Needs: Not on file  Physical Activity: Inactive (03/20/2023)   Exercise Vital Sign    Days of Exercise per Week: 0 days    Minutes of Exercise per Session: 0 min  Stress: Not on file  Social Connections: Not on file  Intimate Partner Violence: Not on file    Family History  Problem Relation Age of Onset   Hypertension Father    Hypothyroidism Father    Coronary artery disease Father    Hypothyroidism Maternal Grandmother    Parkinson's disease Maternal Grandfather    Diabetes type II Paternal Grandmother     ROS: no fevers or chills, productive cough, hemoptysis, dysphasia, odynophagia, melena, hematochezia, dysuria, hematuria, rash, seizure activity, orthopnea, PND, pedal edema, claudication. Remaining systems are negative.  Physical Exam:   Blood pressure (!) 86/62, pulse 99, height 5' 7.75" (1.721 m), weight 132  lb 14.4 oz (60.3 kg), last menstrual period 02/24/2023, SpO2 97%.  General:  Well developed/well nourished in NAD Skin warm/dry Patient not depressed No peripheral clubbing Back-normal HEENT-normal/normal eyelids Neck supple/normal carotid upstroke bilaterally; no bruits; no JVD; no thyromegaly chest - CTA/ normal expansion CV - RRR/normal S1 and S2; no murmurs, rubs or gallops;  PMI nondisplaced Abdomen -NT/ND, no HSM, no mass, + bowel sounds, no bruit 2+ femoral pulses, no bruits Ext-no edema, chords, 2+ DP Neuro-grossly nonfocal  ECG  -February 06, 2023-normal sinus rhythm with no ST changes.  Personally reviewed  Electrocardiogram today shows normal sinus rhythm at a rate of 96, RV conduction delay, no ST changes.  A/P  1 chest pain-symptoms are extremely atypical.  Electrocardiogram shows no ST changes.  Will not pursue further ischemia evaluation.  Notes she can exercise without any chest pain.  2 DOE-no evidence of volume overload.  Occurred in the setting of upper respiratory infection.  Will arrange an echocardiogram to assess LV function.  If normal will not pursue further evaluation.  Olga Millers, MD

## 2023-04-04 ENCOUNTER — Ambulatory Visit (INDEPENDENT_AMBULATORY_CARE_PROVIDER_SITE_OTHER): Payer: Self-pay

## 2023-04-04 ENCOUNTER — Ambulatory Visit: Payer: Self-pay | Admitting: Family Medicine

## 2023-04-04 VITALS — BP 106/54 | HR 80 | Ht 68.0 in | Wt 132.0 lb

## 2023-04-04 DIAGNOSIS — R052 Subacute cough: Secondary | ICD-10-CM

## 2023-04-04 DIAGNOSIS — R0781 Pleurodynia: Secondary | ICD-10-CM

## 2023-04-04 NOTE — Progress Notes (Signed)
Pt reports that she has continued sxs since 7/5. She has been coughing of which has caused her to have R sided rib pain. She did finish the prednisone and tessalon.

## 2023-04-04 NOTE — Progress Notes (Signed)
   Acute Office Visit  Subjective:     Patient ID: Kristina Black, female    DOB: 1990/12/03, 32 y.o.   MRN: [id number]  No chief complaint on file.   HPI Patient is in today for right lower ant  rib pain. Has had cough for early July.  The prednisone helped and finished it on the 18th.  In general the cough is less frequent but it still gets triggered easily with increased activity more recently in the last week she has started to try to run again for exercise and has just noticed that her stamina is significantly impacted.  She gets more easily winded.  She also is having some right sided anterior rib pain that occurs particularly with coughing.  ST started yesterday after drinking from a hand washed cupt.  No fever or chills.  ROS      Objective:    BP (!) 106/54   Pulse 80   Ht 5\' 8"  (1.727 m)   Wt 132 lb (59.9 kg)   LMP 02/24/2023 (Exact Date)   SpO2 100%   BMI 20.07 kg/m    Physical Exam Vitals and nursing note reviewed.  Constitutional:      Appearance: She is well-developed.  HENT:     Head: Normocephalic and atraumatic.  Cardiovascular:     Rate and Rhythm: Normal rate and regular rhythm.     Heart sounds: Normal heart sounds.  Pulmonary:     Effort: Pulmonary effort is normal.     Breath sounds: Normal breath sounds. No wheezing.     Comments: Coarse BS on the right lower lobe.   Skin:    General: Skin is warm and dry.  Neurological:     Mental Status: She is alert and oriented to person, place, and time.  Psychiatric:        Behavior: Behavior normal.     No results found for any visits on 04/04/23.      Assessment & Plan:   Problem List Items Addressed This Visit   None Visit Diagnoses     Rib pain on right side    -  Primary   Relevant Orders   DG Ribs Unilateral W/Chest Right   Subacute cough       Relevant Orders   DG Ribs Unilateral W/Chest Right      Rib Pain-suspect just some muscle strain from chronic coughing for the last  almost 7 to 8 weeks.  But will get a plain film just to make sure there is no other underlying issues with the lung itself or possibly a cracked rib.  Will call with results once available.  Chronic cough-it does sound like it is gradually getting less frequent which is very reassuring.  No orders of the defined types were placed in this encounter.   No follow-ups on file.  Nani Gasser, MD

## 2023-04-06 NOTE — Progress Notes (Signed)
Hi Kristina Black, your x-ray does show a fracture at the ninth rib.  It is in normal alignment which is great.  I suspect this was from just persistent coughing so the main treatment is trying to continue to reduce your cough so it is not irritating the area.  Also just taking your forearm or small pillow and placing a little bit of pressure on that area when you are going to cough or sneeze or even laugh.  Just because that can cause pain and discomfort.  It can take 10 to 12 weeks for rib fracture to completely heal so just try to avoid any excess pressure or trauma to that area as it is healing but that definitely explains your pain and discomfort.  We recommend using Tylenol or ibuprofen for pain relief.  I also would like to consider checking your vitamin D at some point if possible just to make sure that your bone health is normal and that you are not deficient.

## 2023-04-10 ENCOUNTER — Ambulatory Visit: Payer: Self-pay | Attending: Cardiology | Admitting: Cardiology

## 2023-04-10 ENCOUNTER — Encounter: Payer: Self-pay | Admitting: Cardiology

## 2023-04-10 VITALS — BP 86/62 | HR 99 | Ht 67.75 in | Wt 132.9 lb

## 2023-04-10 DIAGNOSIS — R0609 Other forms of dyspnea: Secondary | ICD-10-CM

## 2023-04-10 DIAGNOSIS — R079 Chest pain, unspecified: Secondary | ICD-10-CM

## 2023-04-10 NOTE — Patient Instructions (Signed)
Medication Instructions:  *If you need a refill on your cardiac medications before your next appointment, please call your pharmacy*   Lab Work: If you have labs (blood work) drawn today and your tests are completely normal, you will receive your results only by: MyChart Message (if you have MyChart) OR A paper copy in the mail If you have any lab test that is abnormal or we need to change your treatment, we will call you to review the results.   Testing/Procedures: Your physician has requested that you have an echocardiogram. Echocardiography is a painless test that uses sound waves to create images of your heart. It provides your doctor with information about the size and shape of your heart and how well your heart's chambers and valves are working. This procedure takes approximately one hour. There are no restrictions for this procedure. Please do NOT wear perfume OR lotions (deodorant is allowed). Please arrive 15 minutes prior to your appointment time.    Follow-Up: At [facility], you and your health needs are our priority.  As part of our continuing mission to provide you with exceptional heart care, we have created designated Provider Care Teams.  These Care Teams include your primary Cardiologist (physician) and Advanced Practice Providers (APPs -  Physician Assistants and Nurse Practitioners) who all work together to provide you with the care you need, when you need it.  We recommend signing up for the patient portal called "MyChart".  Sign up information is provided on this After Visit Summary.  MyChart is used to connect with patients for Virtual Visits (Telemedicine).  Patients are able to view lab/test results, encounter notes, upcoming appointments, etc.  Non-urgent messages can be sent to your provider as well.   To learn more about what you can do with MyChart, go to [email or web address].    Your next appointment:    As needed

## 2023-04-13 ENCOUNTER — Ambulatory Visit (HOSPITAL_COMMUNITY): Payer: Self-pay | Attending: Cardiology

## 2023-04-13 DIAGNOSIS — R0609 Other forms of dyspnea: Secondary | ICD-10-CM | POA: Insufficient documentation

## 2023-04-13 LAB — ECHOCARDIOGRAM COMPLETE
Area-P 1/2: 3.37 cm2
MV M vel: 4.48 m/s
MV Peak grad: 80.3 mmHg
S' Lateral: 2.7 cm

## 2023-05-25 ENCOUNTER — Ambulatory Visit: Payer: Medicaid Other | Admitting: Family Medicine

## 2023-06-19 ENCOUNTER — Encounter: Payer: Self-pay | Admitting: Family Medicine

## 2023-06-21 ENCOUNTER — Ambulatory Visit (INDEPENDENT_AMBULATORY_CARE_PROVIDER_SITE_OTHER): Payer: Self-pay | Admitting: Family Medicine

## 2023-06-21 ENCOUNTER — Encounter: Payer: Self-pay | Admitting: Family Medicine

## 2023-06-21 VITALS — BP 100/57 | HR 97 | Ht 68.0 in | Wt 136.0 lb

## 2023-06-21 DIAGNOSIS — F411 Generalized anxiety disorder: Secondary | ICD-10-CM

## 2023-06-21 DIAGNOSIS — B084 Enteroviral vesicular stomatitis with exanthem: Secondary | ICD-10-CM | POA: Diagnosis not present

## 2023-06-21 DIAGNOSIS — Z113 Encounter for screening for infections with a predominantly sexual mode of transmission: Secondary | ICD-10-CM

## 2023-06-21 DIAGNOSIS — Z1159 Encounter for screening for other viral diseases: Secondary | ICD-10-CM | POA: Diagnosis not present

## 2023-06-21 DIAGNOSIS — E039 Hypothyroidism, unspecified: Secondary | ICD-10-CM | POA: Diagnosis not present

## 2023-06-21 DIAGNOSIS — R5381 Other malaise: Secondary | ICD-10-CM

## 2023-06-21 DIAGNOSIS — R5383 Other fatigue: Secondary | ICD-10-CM | POA: Diagnosis not present

## 2023-06-21 DIAGNOSIS — F39 Unspecified mood [affective] disorder: Secondary | ICD-10-CM

## 2023-06-21 LAB — POC COVID19 BINAXNOW: SARS Coronavirus 2 Ag: NEGATIVE

## 2023-06-21 NOTE — Assessment & Plan Note (Signed)
Working on [employer], [employer]ery

## 2023-06-21 NOTE — Assessment & Plan Note (Signed)
Discussed could consider light therapy in the mornings this time of year.

## 2023-06-21 NOTE — Progress Notes (Signed)
Established Patient Office Visit  Subjective   Patient ID: Kristina Black, female    DOB: 03/01/1991  Age: 32 y.o. MRN: [id number]  Chief Complaint  Patient presents with   sti testing    HPI  She had a fever on 10/30 and 10/31 and then on 11/1 noticed lsesion on her hands.  Went to UC on 11/2 and dx with Hand, foot mouth. No oral lesion currently. Fever has resolved.  Used tyelnol. She would like to be tested for COVID    Also takes MVI, Vitamin D and Airborne.   Would also like to have STI testing  Hoping to return back to work in Jan.     ROS    Objective:     BP (!) 100/57   Pulse 97   Ht 5\' 8"  (1.727 m)   Wt 136 lb (61.7 kg)   SpO2 100%   BMI 20.68 kg/m    Physical Exam Vitals reviewed.  Constitutional:      Appearance: Normal appearance.  HENT:     Head: Normocephalic.  Pulmonary:     Effort: Pulmonary effort is normal.  Skin:    Comments:  A few scattered pink papules on her right and left hand.   Neurological:     Mental Status: She is alert and oriented to person, place, and time.  Psychiatric:        Mood and Affect: Mood normal.        Behavior: Behavior normal.      Results for orders placed or performed in visit on 06/21/23  POC COVID-19  Result Value Ref Range   SARS Coronavirus 2 Ag Negative Negative      The ASCVD Risk score (Arnett DK, et al., 2019) failed to calculate for the following reasons:   The 2019 ASCVD risk score is only valid for ages 35 to 75    Assessment & Plan:   Problem List Items Addressed This Visit       Endocrine   Hypothyroid    Recheck levels.        Relevant Orders   Hepatitis C Antibody   STI Profile, CT/Black/TV   TSH + free T4     Other   Mild mood disorder (HCC)    Discussed could consider light therapy in the mornings this time of year.        GAD (generalized anxiety disorder)    Working on Designer, jewellery      Other Visit Diagnoses     Screening examination for STI    -  Primary    Relevant Orders   Hepatitis C Antibody   STI Profile, CT/Black/TV   TSH + free T4   Encounter for hepatitis C screening test for low risk patient       Relevant Orders   Hepatitis C Antibody   STI Profile, CT/Black/TV   TSH + free T4   Hand, foot and mouth disease       Malaise and fatigue       Relevant Orders   POC COVID-19 (Completed)      Hand-foot-and-mouth-discussed that the fever that she had a couple days before she noticed the rash is quite typical as well as the fatigue and bodyaches.  We did go ahead and swab for COVID today and it was negative.  We did discuss precautions around having hand-foot-and-mouth.  Call if any problems or concerns.  STI testing and hepatitis C tests ordered.  She will likely  return next week to have that done when she is feeling better  Return if symptoms worsen or fail to improve.    Nani Gasser, MD

## 2023-06-21 NOTE — Assessment & Plan Note (Signed)
Recheck levels 

## 2023-06-22 ENCOUNTER — Ambulatory Visit
Admission: EM | Admit: 2023-06-22 | Discharge: 2023-06-22 | Disposition: A | Discharge status: Home / Self Care | Payer: Medicaid Other | Attending: Internal Medicine | Admitting: Internal Medicine

## 2023-06-22 DIAGNOSIS — B084 Enteroviral vesicular stomatitis with exanthem: Secondary | ICD-10-CM | POA: Diagnosis present

## 2023-06-22 NOTE — ED Triage Notes (Signed)
Pt presents to uc with co of recent diagnosed with hand foot and mouth on Sunday and has a new bump on left wrist that concerns her as it is different in shape. Pt was told that hand foot and mouth is a virus and will eventually go away in time.

## 2023-06-22 NOTE — Discharge Instructions (Addendum)
Advised watchful waiting for now advise skin lesions will resolve on their own allow skin to heal by secondary intention.  Advised if symptoms worsen or concerns arise please follow-up with PCP for further evaluation.

## 2023-06-22 NOTE — ED Provider Notes (Signed)
[facility]    CSN: [id number] Arrival date & time: 06/22/23  1455      History   Chief Complaint Chief Complaint  Patient presents with   Rash    HPI Kristina Black is a 32 y.o. female.   HPI Very pleasant, somewhat anxious 32 year old female is concerned about worsening hand foot mouth disease.  Patient presents with areas covered with Band-Aids.  Patient is concerned that 1 erythematous area of left wrist (volar aspect is different than hand foot and mouth disease.  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 No pertinent surgical history.  OB History   No obstetric history on file.      Home Medications    Prior to Admission medications   Medication Sig Start Date End Date Taking? Authorizing Provider  levothyroxine (SYNTHROID) 100 MCG tablet Take 1 tablet (100 mcg total) by mouth daily. 02/03/23   Jomarie Longs, PA-C    Family History Family History  Problem Relation Age of Onset   Hypertension Father    Hypothyroidism Father    Coronary artery disease Father    Hypothyroidism Maternal Grandmother    Parkinson's disease Maternal Grandfather    Diabetes type II Paternal Grandmother     Social History Social History   Tobacco Use   Smoking status: Never   Smokeless tobacco: Never  Substance Use Topics   Alcohol use: No   Drug use: No     Allergies   Lamotrigine, Mirtazapine, Paliperidone er, Aripiprazole, and Penicillins   Review of Systems Review of Systems  Skin:  Positive for rash.  All  other systems reviewed and are negative.    Physical Exam Triage Vital Signs ED Triage Vitals  Encounter Vitals Group     BP 06/22/23 1555 118/83     Systolic BP Percentile --      Diastolic BP Percentile --      Pulse Rate 06/22/23 1555 95     Resp 06/22/23 1555 19     Temp 06/22/23 1555 98.4 F (36.9 C)     Temp src --      SpO2 06/22/23 1555 98 %     Weight --      Height --      Head Circumference --      Peak Flow --      Pain Score 06/22/23 1553 0     Pain Loc --      Pain Education --      Exclude from Growth Chart --    No data found.  Updated Vital Signs BP 118/83   Pulse 95   Temp 98.4 F (36.9 C)   Resp 19   LMP 06/19/2023   SpO2 98%   Physical Exam Vitals and nursing note reviewed.  Constitutional:      Appearance: Normal appearance. She is normal weight.  HENT:     Head: Normocephalic and atraumatic.     Mouth/Throat:     Mouth: Mucous membranes are moist.  Pharynx: Oropharynx is clear.  Eyes:     Extraocular Movements: Extraocular movements intact.     Conjunctiva/sclera: Conjunctivae normal.     Pupils: Pupils are equal, round, and reactive to light.  Cardiovascular:     Rate and Rhythm: Normal rate and regular rhythm.     Pulses: Normal pulses.     Heart sounds: Normal heart sounds.  Pulmonary:     Effort: Pulmonary effort is normal.     Breath sounds: Normal breath sounds. No wheezing, rhonchi or rales.  Musculoskeletal:        General: Normal range of motion.     Cervical back: Normal range of motion and neck supple.  Skin:    General: Skin is warm and dry.     Comments: Mildly erythematous papules noted please see images below  Neurological:     General: No focal deficit present.     Mental Status: She is alert and oriented to person, place, and time. Mental status is at baseline.  Psychiatric:        Mood and Affect: Mood normal.        Behavior: Behavior normal.        Thought Content: Thought content normal.          UC Treatments / Results  Labs (all labs ordered are listed, but only abnormal results are displayed) Labs Reviewed - No data to display  EKG   Radiology No results found.  Procedures Procedures (including critical care time)  Medications Ordered in UC Medications - No data to display  Initial Impression / Assessment and Plan / UC Course  I have reviewed the triage vital signs and the nursing notes.  Pertinent labs & imaging results that were available during my care of the patient were reviewed by me and considered in my medical decision making (see chart for details).     MDM: 1.  Hand, foot and mouth disease-Advised watchful waiting for now advise skin lesions will resolve on their own allow skin to heal by secondary intention.  Advised if symptoms worsen or concerns arise please follow-up with PCP for further evaluation.  Patient discharged home, hemodynamically stable. Final Clinical Impressions(s) / UC Diagnoses   Final diagnoses:  Hand, foot and mouth disease     Discharge Instructions      Advised watchful waiting for now advise skin lesions will resolve on their own allow skin to heal by secondary intention.  Advised if symptoms worsen or concerns arise please follow-up with PCP for further evaluation.     ED Prescriptions   None    PDMP not reviewed this encounter.   Trevor Iha, FNP 06/26/23 [phone]

## 2023-06-26 ENCOUNTER — Telehealth: Payer: Self-pay | Admitting: Family Medicine

## 2023-06-26 DIAGNOSIS — Z0000 Encounter for general adult medical examination without abnormal findings: Secondary | ICD-10-CM

## 2023-06-26 NOTE — Telephone Encounter (Signed)
Good Morning, Patient is requesting fasting labs prior to her appointment scheduled December 11th

## 2023-06-26 NOTE — Telephone Encounter (Signed)
 Orders Placed This Encounter  Procedures   CMP14+EGFR   Lipid panel   CBC

## 2023-06-26 NOTE — Addendum Note (Signed)
Addended by: Nani Gasser D on: 06/26/2023 11:07 AM   Modules accepted: Orders

## 2023-06-27 ENCOUNTER — Other Ambulatory Visit: Payer: Self-pay | Admitting: Medical Genetics

## 2023-06-27 DIAGNOSIS — Z006 Encounter for examination for normal comparison and control in clinical research program: Secondary | ICD-10-CM

## 2023-07-26 ENCOUNTER — Encounter: Payer: Self-pay | Admitting: Family Medicine

## 2023-07-26 ENCOUNTER — Other Ambulatory Visit (HOSPITAL_COMMUNITY): Payer: Medicaid Other

## 2023-07-26 ENCOUNTER — Ambulatory Visit (INDEPENDENT_AMBULATORY_CARE_PROVIDER_SITE_OTHER): Payer: Self-pay | Admitting: Family Medicine

## 2023-07-26 VITALS — BP 97/39 | HR 74 | Ht 68.0 in | Wt 148.0 lb

## 2023-07-26 DIAGNOSIS — Z1159 Encounter for screening for other viral diseases: Secondary | ICD-10-CM

## 2023-07-26 DIAGNOSIS — F4310 Post-traumatic stress disorder, unspecified: Secondary | ICD-10-CM | POA: Insufficient documentation

## 2023-07-26 DIAGNOSIS — R0789 Other chest pain: Secondary | ICD-10-CM

## 2023-07-26 DIAGNOSIS — Z113 Encounter for screening for infections with a predominantly sexual mode of transmission: Secondary | ICD-10-CM

## 2023-07-26 DIAGNOSIS — E039 Hypothyroidism, unspecified: Secondary | ICD-10-CM

## 2023-07-26 NOTE — Assessment & Plan Note (Signed)
To recheck TSH level.  Last TSH in June was 2.2.  Will make any adjustments needed currently taking 100 mcg.

## 2023-07-26 NOTE — Assessment & Plan Note (Signed)
Had some intermittent left-sided chest pain most towards the axilla she did see cardiology and had a negative workup but she is still getting some discomfort there from time to time.

## 2023-07-26 NOTE — Progress Notes (Signed)
Established Patient Office Visit  Subjective   Patient ID: Kristina Black, female    DOB: [birth year]  Age: 32 y.o. MRN: [id number]  Chief Complaint  Patient presents with   Hypothyroidism    HPI  F/u hypothyroid - she feels really fatigued.  Recently traveled to visit her grandparents.  Was eating more carbs and was less active while there.  She is also noticed that her nails seem more brittle and her skin seems more inflamed.    She unfortunately has had a flare with her PTSD recently.  She feels like some of her anxiety has really crept back in she is not currently on medication and not currently engaged in therapy she says right now it is a cost issue but she has been managing by using some of her techniques and relying on her faith.  She would like to get the labs done that she had previously requested because of her recent travel she was not be able to get back in and get that done.      ROS    Objective:     BP (!) 97/39   Pulse 74   Ht 5\' 8"  (1.727 m)   Wt 148 lb (67.1 kg)   SpO2 100%   BMI 22.50 kg/m    Physical Exam Vitals and nursing note reviewed.  Constitutional:      Appearance: Normal appearance.  HENT:     Head: Normocephalic and atraumatic.  Eyes:     Conjunctiva/sclera: Conjunctivae normal.  Cardiovascular:     Rate and Rhythm: Normal rate and regular rhythm.  Pulmonary:     Effort: Pulmonary effort is normal.     Breath sounds: Normal breath sounds.  Musculoskeletal:     Cervical back: Neck supple. No tenderness.  Skin:    General: Skin is warm and dry.  Neurological:     Mental Status: She is alert.  Psychiatric:        Mood and Affect: Mood normal.      No results found for any visits on 07/26/23.    The ASCVD Risk score (Arnett DK, et al., 2019) failed to calculate for the following reasons:   The 2019 ASCVD risk score is only valid for ages 40 to 17    Assessment & Plan:   Problem List Items Addressed This Visit        Endocrine   Hypothyroid    To recheck TSH level.  Last TSH in June was 2.2.  Will make any adjustments needed currently taking 100 mcg.      Relevant Orders   CBC   Lipid panel   CMP14+EGFR   TSH + free T4   Hepatitis C Antibody   STI Profile, CT/NG/TV     Other   PTSD (post-traumatic stress disorder)    Right now does not feel that she has the funds to access therapy/counseling.  Relying on techniques and her faith.  He does not have the most supportive home environment currently.  She is hoping to find a new job in January.  Getting out of the house she feels is actually very helpful for her.      Chest pain    Had some intermittent left-sided chest pain most towards the axilla she did see cardiology and had a negative workup but she is still getting some discomfort there from time to time.      Other Visit Diagnoses     Screening examination for STI    -  Primary   Relevant Orders   CBC   Lipid panel   CMP14+EGFR   TSH + free T4   Hepatitis C Antibody   STI Profile, CT/NG/TV   Encounter for hepatitis C screening test for low risk patient       Relevant Orders   CBC   Lipid panel   CMP14+EGFR   TSH + free T4   Hepatitis C Antibody   STI Profile, CT/NG/TV       Return if symptoms worsen or fail to improve.    Nani Gasser, MD

## 2023-07-26 NOTE — Assessment & Plan Note (Signed)
Right now does not feel that she has the funds to access therapy/counseling.  Relying on techniques and her faith.  He does not have the most supportive home environment currently.  She is hoping to find a new job in January.  Getting out of the house she feels is actually very helpful for her.

## 2023-07-28 NOTE — Progress Notes (Signed)
Metabolic panel overall looks good.  Blood count looks good no sign of anemia.  Cholesterol looks great.  Thyroid looks great.  Negative hepatitis panel.  Additional test still pending.

## 2023-07-30 LAB — CBC
Hematocrit: 40.1 % (ref 34.0–46.6)
Hemoglobin: 13.2 g/dL (ref 11.1–15.9)
MCH: 29.1 pg (ref 26.6–33.0)
MCHC: 32.9 g/dL (ref 31.5–35.7)
MCV: 88 fL (ref 79–97)
Platelets: 206 10*3/uL (ref 150–450)
RBC: 4.54 x10E6/uL (ref 3.77–5.28)
RDW: 13.3 % (ref 11.7–15.4)
WBC: 6.1 10*3/uL (ref 3.4–10.8)

## 2023-07-30 LAB — CMP14+EGFR
ALT: 14 [IU]/L (ref 0–32)
AST: 16 [IU]/L (ref 0–40)
Albumin: 4.4 g/dL (ref 3.9–4.9)
Alkaline Phosphatase: 75 [IU]/L (ref 44–121)
BUN/Creatinine Ratio: 13 (ref 9–23)
BUN: 12 mg/dL (ref 6–20)
Bilirubin Total: 0.5 mg/dL (ref 0.0–1.2)
CO2: 25 mmol/L (ref 20–29)
Calcium: 9.3 mg/dL (ref 8.7–10.2)
Chloride: 102 mmol/L (ref 96–106)
Creatinine, Ser: 0.9 mg/dL (ref 0.57–1.00)
Globulin, Total: 2.2 g/dL (ref 1.5–4.5)
Glucose: 59 mg/dL — ABNORMAL LOW (ref 70–99)
Potassium: 3.7 mmol/L (ref 3.5–5.2)
Sodium: 144 mmol/L (ref 134–144)
Total Protein: 6.6 g/dL (ref 6.0–8.5)
eGFR: 87 mL/min/{1.73_m2} (ref >59)

## 2023-07-30 LAB — STI PROFILE, CT/NG/TV
HCV Ab: NONREACTIVE
HIV Screen 4th Generation wRfx: NONREACTIVE
Hep B Core Total Ab: NEGATIVE
Hep B Surface Ab, Qual: REACTIVE
Hepatitis B Surface Ag: NEGATIVE
RPR Ser Ql: NONREACTIVE

## 2023-07-30 LAB — HCV INTERPRETATION

## 2023-07-30 LAB — LIPID PANEL
Chol/HDL Ratio: 2.8 {ratio} (ref 0.0–4.4)
Cholesterol, Total: 171 mg/dL (ref 100–199)
HDL: 61 mg/dL (ref >39)
LDL Chol Calc (NIH): 93 mg/dL (ref 0–99)
Triglycerides: 94 mg/dL (ref 0–149)
VLDL Cholesterol Cal: 17 mg/dL (ref 5–40)

## 2023-07-30 LAB — HEPATITIS C ANTIBODY

## 2023-07-30 LAB — TSH+FREE T4
Free T4: 1.43 ng/dL (ref 0.82–1.77)
TSH: 2.25 u[IU]/mL (ref 0.450–4.500)

## 2023-07-31 NOTE — Progress Notes (Signed)
Negative STD testing

## 2023-08-11 ENCOUNTER — Other Ambulatory Visit (HOSPITAL_COMMUNITY)
Admission: RE | Admit: 2023-08-11 | Discharge: 2023-08-11 | Disposition: A | Discharge status: Home / Self Care | Payer: Medicaid Other | Source: Ambulatory Visit | Attending: Oncology | Admitting: Oncology

## 2023-08-11 DIAGNOSIS — Z006 Encounter for examination for normal comparison and control in clinical research program: Secondary | ICD-10-CM | POA: Insufficient documentation

## 2023-08-26 LAB — GENECONNECT MOLECULAR SCREEN: Genetic Analysis Overall Interpretation: NEGATIVE

## 2023-10-02 ENCOUNTER — Ambulatory Visit (INDEPENDENT_AMBULATORY_CARE_PROVIDER_SITE_OTHER): Payer: Self-pay | Admitting: Family Medicine

## 2023-10-02 ENCOUNTER — Ambulatory Visit: Payer: Self-pay | Admitting: Family Medicine

## 2023-10-02 VITALS — BP 114/81 | HR 91 | Temp 98.1°F | Ht 68.0 in | Wt 140.0 lb

## 2023-10-02 DIAGNOSIS — R6889 Other general symptoms and signs: Secondary | ICD-10-CM

## 2023-10-02 DIAGNOSIS — J069 Acute upper respiratory infection, unspecified: Secondary | ICD-10-CM | POA: Insufficient documentation

## 2023-10-02 LAB — POCT INFLUENZA A/B
Influenza A, POC: NEGATIVE
Influenza B, POC: NEGATIVE

## 2023-10-02 NOTE — Patient Instructions (Signed)

## 2023-10-02 NOTE — Progress Notes (Signed)
 Kristina Black - 33 y.o. female MRN [id number]  Date of birth: 07/16/1991  Subjective Chief Complaint  Patient presents with   Flu Symptoms    HPI Kristina Black is a 33 y.o. here today with complaint of fever, chills, body aches, congestion, cough, and fatigue.  Her symptoms started about 5 days ago.  Tmax of 100.4.  She is feeling a little better today compared to yesterday.  She denies dyspnea or GI symptoms.  She has used tylenol as needed. She is drinking plenty of fluids.   ROS:  A comprehensive ROS was completed and negative except as noted per HPI  Allergies  Allergen Reactions   Lamotrigine Rash   Mirtazapine     Other reaction(s): Other Suicidal ideation, racing thoughts   Paliperidone Er     Other reaction(s): Dizziness, Hypotension   Aripiprazole Rash and Other (See Comments)    Muscle cramping, diarrhea, blurry vision,disorientation,heart racing,loss of dexterity in fingers/hands    Penicillins     As a baby. She is unaware of the reaction.     Past Medical History:  Diagnosis Date   Allergy    Anxiety    Thyroid disease     History reviewed. No pertinent surgical history.  Social History   Socioeconomic History   Marital status: Single    Spouse name: Not on file   Number of children: Not on file   Years of education: Not on file   Highest education level: Bachelor's degree (e.g., BA, AB, BS)  Occupational History   Occupation: employed  Tobacco Use   Smoking status: Never   Smokeless tobacco: Never  Substance and Sexual Activity   Alcohol use: No   Drug use: No   Sexual activity: Yes  Other Topics Concern   Not on file  Social History Narrative   She does exercise.    Social Drivers of Health   Financial Resource Strain: Medium Risk (10/02/2023)   Overall Financial Resource Strain (CARDIA)    Difficulty of Paying Living Expenses: Somewhat hard  Food Insecurity: Food Insecurity Present (10/02/2023)   Hunger Vital Sign    Worried About  Running Out of Food in the Last Year: Sometimes true    Ran Out of Food in the Last Year: Never true  Transportation Needs: No Transportation Needs (10/02/2023)   PRAPARE - Administrator, Civil Service (Medical): No    Lack of Transportation (Non-Medical): No  Physical Activity: Sufficiently Active (10/02/2023)   Exercise Vital Sign    Days of Exercise per Week: 5 days    Minutes of Exercise per Session: 30 min  Stress: Stress Concern Present (10/02/2023)   [employer] of Occupational Health - Occupational Stress Questionnaire    Feeling of Stress : To some extent  Social Connections: Unknown (10/02/2023)   Social Connection and Isolation Panel [NHANES]    Frequency of Communication with Friends and Family: Once a week    Frequency of Social Gatherings with Friends and Family: Patient declined    Attends Religious Services: More than 4 times per year    Active Member of [employer] or Organizations: No    Attends Engineer, structural: Not on file    Marital Status: Never married    Family History  Problem Relation Age of Onset   Hypertension Father    Hypothyroidism Father    Coronary artery disease Father    Hypothyroidism Maternal Grandmother    Parkinson's disease Maternal Grandfather    Diabetes type  II Paternal Grandmother     Health Maintenance  Topic Date Due   COVID-19 Vaccine (6 - 2024-25 season) 04/16/2023   DTaP/Tdap/Td (3 - Td or Tdap) 12/25/2026   Cervical Cancer Screening (HPV/Pap Cotest)  03/19/2028   INFLUENZA VACCINE  Completed   Hepatitis C Screening  Completed   HIV Screening  Completed   HPV VACCINES  Aged Out     ----------------------------------------------------------------------------------------------------------------------------------------------------------------------------------------------------------------- Physical Exam BP 114/81 (BP Location: Left Arm, Patient Position: Sitting, Cuff Size: Normal)   Pulse 91   Temp  98.1 F (36.7 C) (Oral)   Ht 5\' 8"  (1.727 m)   Wt 140 lb (63.5 kg)   SpO2 99%   BMI 21.29 kg/m   Physical Exam Constitutional:      Appearance: Normal appearance.  HENT:     Head: Normocephalic and atraumatic.  Eyes:     General: No scleral icterus. Cardiovascular:     Rate and Rhythm: Normal rate and regular rhythm.  Pulmonary:     Effort: Pulmonary effort is normal.     Breath sounds: Normal breath sounds.  Musculoskeletal:     Cervical back: Neck supple.  Neurological:     Mental Status: She is alert.  Psychiatric:        Mood and Affect: Mood normal.        Behavior: Behavior normal.     ------------------------------------------------------------------------------------------------------------------------------------------------------------------------------------------------------------------- Assessment and Plan  URI (upper respiratory infection) Symptoms do seem to be improving at this point.  Negative for influenza.  Recommend continued supportive care with increased fluids and OTC medications as needed for symptom management.  Red flags discussed.  Contact clinic if symptoms fail to improve.    No orders of the defined types were placed in this encounter.   No follow-ups on file.    This visit occurred during the SARS-CoV-2 public health emergency.  Safety protocols were in place, including screening questions prior to the visit, additional usage of staff PPE, and extensive cleaning of exam room while observing appropriate contact time as indicated for disinfecting solutions.

## 2023-10-02 NOTE — Telephone Encounter (Signed)
  Chief Complaint: fever Symptoms: fever, chills, decreased appetite, sore throat, headache, dizzy, swollen lymph nodes Frequency: 7 days Pertinent Negatives: Patient denies CP, SOB, NVD Disposition: [] ED /[] Urgent Care (no appt availability in office) / [x] Appointment(In office/virtual)/ []  [facility]/ [] Home Care/ [] Refused Recommended Disposition /[] [facility]/ []  Follow-up with PCP Additional Notes: Patient calls reporting flu-like symptoms x 7 days. Reports she did have exposure at home, but social distancing was maintained. Patient requests [id number] visit with first available provider today to be tested for flu, strep, covid, and to obtain a Dr. Phoebe Black. Per protocol, patient to be evaluated within 24 hours. First available appointment with PCP 10/03/23 @ 1010. Patient scheduled with first available provider in clinic at patient request for 10/02/23 @ 1510. Care advice reviewed, patient verbalized understandingand denies further questions at this time. Alerting PCP for review.    Copied from CRM [id number]. Topic: Clinical - Red Word Triage >> Oct 02, 2023  8:50 AM Geroge Black wrote: Red Word that prompted transfer to Nurse Triage: Fever, lymph nodes swollen. Sore throat started Tuesday Feb 11. Reason for Disposition  Fever present > 3 days (72 hours)  Answer Assessment - Initial Assessment Questions 1. TEMPERATURE: "What is the most recent temperature?"  "How was it measured?"      0819 this morning 99.65F, 100.69F 3 days ago forehead. 2. ONSET: "When did the fever start?"      7 days 3. CHILLS: "Do you have chills?" If yes: "How bad are they?"  (e.g., none, mild, moderate, severe)   - NONE: no chills   - MILD: feeling cold   - MODERATE: feeling very cold, some shivering (feels better under a thick blanket)   - SEVERE: feeling extremely cold with shaking chills (general body shaking, rigors; even under a thick blanket)      Mild 4. OTHER SYMPTOMS: "Do you have any other  symptoms besides the fever?"  (e.g., abdomen pain, cough, diarrhea, earache, headache, sore throat, urination pain)     Sore throat, swollen lymph node, decreased appetite, headache, dizzy 5. CAUSE: If there are no symptoms, ask: "What do you think is causing the fever?"      Possibly flu 6. CONTACTS: "Does anyone else in the family have an infection?"     Someone at home had flu approx 1 weeks ago. 7. TREATMENT: "What have you done so far to treat this fever?" (e.g., medications)     Tylenol 8. IMMUNOCOMPROMISE: "Do you have of the following: diabetes, HIV positive, splenectomy, cancer chemotherapy, chronic steroid treatment, transplant patient, etc."     Hypothyroid 9. PREGNANCY: "Is there any chance you are pregnant?" "When was your last menstrual period?"     Denies, LMP: 09/14/23 10. TRAVEL: "Have you traveled out of the country in the last month?" (e.g., travel history, exposures)       Denies  Protocols used: [facility]

## 2023-10-02 NOTE — Assessment & Plan Note (Signed)
 Symptoms do seem to be improving at this point.  Negative for influenza.  Recommend continued supportive care with increased fluids and OTC medications as needed for symptom management.  Red flags discussed.  Contact clinic if symptoms fail to improve.

## 2023-10-09 ENCOUNTER — Encounter: Payer: Self-pay | Admitting: Family Medicine

## 2024-02-05 ENCOUNTER — Other Ambulatory Visit: Payer: Self-pay | Admitting: Family Medicine

## 2024-02-05 DIAGNOSIS — E039 Hypothyroidism, unspecified: Secondary | ICD-10-CM

## 2024-02-05 MED ORDER — LEVOTHYROXINE SODIUM 100 MCG PO TABS: 100.0000 ug | ORAL_TABLET | Freq: Every day | ORAL | 1 refills | Status: DC

## 2024-02-05 NOTE — Telephone Encounter (Signed)
 Copied from CRM [id number]. Topic: Clinical - Medication Refill >> Feb 05, 2024 10:45 AM Latavia C wrote: Medication: levothyroxine  (SYNTHROID ) 100 MCG tablet  Has the patient contacted their pharmacy? No (Agent: If no, request that the patient contact the pharmacy for the refill. If patient does not wish to contact the pharmacy document the reason why and proceed with request.) (Agent: If yes, when and what did the pharmacy advise?)  This is the patient's preferred pharmacy:  [facility] [phone] - [place], [place] - [address] [place] [address] Phone: [phone] Fax: [phone]  Is this the correct pharmacy for this prescription? Yes If no, delete pharmacy and type the correct one.   Has the prescription been filled recently? No  Is the patient out of the medication? Yes  Has the patient been seen for an appointment in the last year OR does the patient have an upcoming appointment? Yes  Can we respond through MyChart? Yes  Agent: Please be advised that Rx refills may take up to 3 business days. We ask that you follow-up with your pharmacy.

## 2024-03-20 ENCOUNTER — Encounter: Payer: BC Managed Care – PPO | Admitting: Family Medicine

## 2024-06-27 ENCOUNTER — Ambulatory Visit
Admission: EM | Admit: 2024-06-27 | Discharge: 2024-06-27 | Disposition: A | Discharge status: Home / Self Care | Attending: Family Medicine | Admitting: Family Medicine

## 2024-06-27 DIAGNOSIS — B349 Viral infection, unspecified: Secondary | ICD-10-CM

## 2024-06-27 LAB — POCT RAPID STREP A (OFFICE): Rapid Strep A Screen: NEGATIVE

## 2024-06-27 LAB — POC SOFIA SARS ANTIGEN FIA: SARS Coronavirus 2 Ag: NEGATIVE

## 2024-06-27 NOTE — ED Provider Notes (Signed)
 [facility]    CSN: [id number] Arrival date & time: 06/27/24  0905      History   Chief Complaint Chief Complaint  Patient presents with   Sore Throat   Cough    HPI Kristina Black is a 33 y.o. female.   HPI  Patient works at a preschool.  She states she has been sick for almost a week.  She has a sore throat and cough, some dizziness, also some abdominal discomfort and constipation.  She states constipation is chronic for her.  She has had some bodyaches and headache.  Unknown fever.  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 No pertinent surgical history.  OB History   No obstetric history on file.      Home Medications    Prior to Admission medications   Medication Sig Start Date End Date Taking? Authorizing Provider  levothyroxine  (SYNTHROID ) 100 MCG tablet Take 1 tablet (100 mcg total) by mouth daily. 02/05/24  Yes Alvan Dorothyann BIRCH, MD    Family History Family History  Problem Relation Age of Onset   Hypertension Father    Hypothyroidism Father    Coronary artery disease Father    Hypothyroidism Maternal Grandmother    Parkinson's disease Maternal Grandfather    Diabetes type II Paternal Grandmother     Social History Social History   Tobacco Use   Smoking status: Never   Smokeless tobacco: Never  Substance Use Topics   Alcohol use: No   Drug use: No     Allergies   Lamotrigine, Mirtazapine,  Paliperidone er, Aripiprazole, and Penicillins   Review of Systems Review of Systems  See HPI Physical Exam Triage Vital Signs ED Triage Vitals  Encounter Vitals Group     BP 06/27/24 0917 97/62     Girls Systolic BP Percentile --      Girls Diastolic BP Percentile --      Boys Systolic BP Percentile --      Boys Diastolic BP Percentile --      Pulse Rate 06/27/24 0917 87     Resp 06/27/24 0917 16     Temp 06/27/24 0917 98.2 F (36.8 C)     Temp Source 06/27/24 0917 Oral     SpO2 06/27/24 0917 98 %     Weight --      Height --      Head Circumference --      Peak Flow --      Pain Score 06/27/24 0923 6     Pain Loc --      Pain Education --      Exclude from Growth Chart --    No data found.  Updated Vital Signs BP 97/62 (BP Location: Right Arm)   Pulse 87   Temp 98.2 F (36.8 C) (Oral)   Resp 16   LMP 06/11/2024 (Approximate)   SpO2 98%      Physical Exam Constitutional:  General: She is not in acute distress.    Appearance: She is well-developed. She is not ill-appearing.  HENT:     Head: Normocephalic and atraumatic.     Right Ear: Tympanic membrane normal.     Left Ear: Tympanic membrane normal.     Nose: No congestion.     Mouth/Throat:     Mouth: Mucous membranes are moist.     Pharynx: Posterior oropharyngeal erythema present. No pharyngeal swelling.     Tonsils: No tonsillar exudate. 0 on the right. 0 on the left.  Eyes:     Conjunctiva/sclera: Conjunctivae normal.     Pupils: Pupils are equal, round, and reactive to light.  Cardiovascular:     Rate and Rhythm: Normal rate and regular rhythm.  Pulmonary:     Effort: Pulmonary effort is normal. No respiratory distress.     Breath sounds: Normal breath sounds.  Abdominal:     General: There is no distension.     Palpations: Abdomen is soft.  Musculoskeletal:        General: Normal range of motion.     Cervical back: Normal range of motion.  Lymphadenopathy:     Cervical: Cervical  adenopathy present.  Skin:    General: Skin is warm and dry.  Neurological:     Mental Status: She is alert.      UC Treatments / Results  Labs (all labs ordered are listed, but only abnormal results are displayed) Labs Reviewed  POCT RAPID STREP A (OFFICE) - Normal  POC SOFIA SARS ANTIGEN FIA - Normal    EKG   Radiology No results found.  Procedures Procedures (including critical care time)  Medications Ordered in UC Medications - No data to display  Initial Impression / Assessment and Plan / UC Course  I have reviewed the triage vital signs and the nursing notes.  Pertinent labs & imaging results that were available during my care of the patient were reviewed by me and considered in my medical decision making (see chart for details).     Strep and COVID are negative.  Patient has a viral illness, not unusual for preschool workers.  Discussed home care. Final Clinical Impressions(s) / UC Diagnoses   Final diagnoses:  Viral illness     Discharge Instructions      Continue to drink lots of fluids. May take Tylenol ibuprofen  for pain and fever See your doctor if not improving by next week   ED Prescriptions   None    PDMP not reviewed this encounter.   Maranda Jamee Jacob, MD 06/27/24 [id number]

## 2024-06-27 NOTE — Discharge Instructions (Signed)
 Continue to drink lots of fluids. May take Tylenol ibuprofen  for pain and fever See your doctor if not improving by next week

## 2024-06-27 NOTE — ED Triage Notes (Addendum)
 Sore throat x 6 days. Cough, congestion, nausea, headache x 1 day. Pt states she is having lower abdominal pain that started today, states it feel like she is constipated. Pt states that today she started to feel dizzy and lightheaded.  Taking motrin  and warm salt water gargles with no relief of pain.

## 2024-08-05 ENCOUNTER — Other Ambulatory Visit: Payer: Self-pay | Admitting: Family Medicine

## 2024-08-05 DIAGNOSIS — E039 Hypothyroidism, unspecified: Secondary | ICD-10-CM

## 2024-08-05 NOTE — Telephone Encounter (Signed)
 Please call pt she is due for an OV for thyroid.

## 2024-08-26 ENCOUNTER — Encounter: Payer: Self-pay | Admitting: Medical-Surgical

## 2024-08-26 ENCOUNTER — Ambulatory Visit: Admitting: Medical-Surgical

## 2024-08-26 VITALS — BP 86/53 | HR 98 | Temp 100.4°F | Resp 20 | Ht 68.0 in | Wt 141.1 lb

## 2024-08-26 DIAGNOSIS — J101 Influenza due to other identified influenza virus with other respiratory manifestations: Secondary | ICD-10-CM

## 2024-08-26 LAB — POC COVID19/FLU A&B COMBO
Covid Antigen, POC: NEGATIVE
Influenza A Antigen, POC: POSITIVE — AB
Influenza B Antigen, POC: NEGATIVE

## 2024-08-26 MED ORDER — OSELTAMIVIR PHOSPHATE 75 MG PO CAPS: 75.0000 mg | ORAL_CAPSULE | Freq: Two times a day (BID) | ORAL | 0 refills | Status: AC

## 2024-08-26 NOTE — Patient Instructions (Signed)

## 2024-08-26 NOTE — Progress Notes (Signed)
 "       Established patient visit   History of Present Illness   Discussed the use of AI scribe software for clinical note transcription with the patient, who gave verbal consent to proceed.  History of Present Illness   Kristina Black is a 34 year old female who presents with respiratory symptoms and fever.  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, heated air - Uses a humidifier at night - Recent use of oil of oregano, which improves breathing  Fever and systemic symptoms - Fever onset last night, maximum temperature 100F this morning - Dizziness, sensation of internal heat, weakness, chills, and body aches - Loose stools earlier today - Headache present  Metabolic symptoms - Hypoglycemia diagnosed February 2025, managed with diet and sleep hygiene - Hypoglycemia worsens with menstruation and during illness - Maintains intake with dairy, beans, and yogurt to support energy  Endocrine history - Hypothyroidism treated with levothyroxine  - Takes a daily women's multivitamin - Inquiring if her TSH can be checked today     Physical Exam   Physical Exam Vitals reviewed.  Constitutional:      General: She is not in acute distress.    Appearance: Normal appearance. She is ill-appearing.  HENT:     Head: Normocephalic and atraumatic.     Right Ear: Hearing, ear canal and external ear normal. A middle ear effusion is present. There is no impacted cerumen.     Left Ear: Hearing, tympanic membrane, ear canal and external ear normal. There is no impacted cerumen.  Cardiovascular:     Rate and Rhythm: Normal rate and regular rhythm.     Pulses: Normal pulses.     Heart sounds: Normal heart sounds. No murmur  heard.    No friction rub. No gallop.  Pulmonary:     Effort: Pulmonary effort is normal. No respiratory distress.     Breath sounds: Normal breath sounds. No wheezing.  Skin:    General: Skin is warm and dry.  Neurological:     Mental Status: She is alert and oriented to person, place, and time.  Psychiatric:        Mood and Affect: Mood normal.        Behavior: Behavior normal.        Thought Content: Thought content normal.        Judgment: Judgment normal.    Assessment & Plan   Influenza A Acute influenza A infection with systemic symptoms. Fever present in the last 24 hours. - Prescribed Tamiflu  75mg  twice daily for five days. - Provided information regarding influenza infection. - Advised symptomatic measures. - Provided work note for today through Wednesday with tentative return on Thursday if fever-free for 24 hours. - Push fluids and aim for regular snacks/meals.   Hypoglycemia Symptoms worsen during menstruation and illness. Discussed risk of prediabetes or diabetes. - Continue dietary management and sleep hygiene. - Monitor for changes in symptoms and energy levels.  Hypothyroidism Managed with levothyroxine . Blood pressure low, consistent with baseline. - Continue levothyroxine  as prescribed. - Ensure adequate hydration.  - Recommend we do no check TSH today due to current illness.     Follow up   Return if symptoms worsen or  fail to improve. __________________________________ Zada FREDRIK Palin, DNP, APRN, FNP-BC Primary Care and Sports Medicine [facility] "

## 2024-09-07 ENCOUNTER — Other Ambulatory Visit: Payer: Self-pay | Admitting: Family Medicine

## 2024-09-07 DIAGNOSIS — E039 Hypothyroidism, unspecified: Secondary | ICD-10-CM

## 2024-11-18 ENCOUNTER — Ambulatory Visit: Admitting: Family Medicine
